# Patient Record
Sex: Male | Born: 1975
Health system: Southern US, Community
[De-identification: ages and names within clinical notes are randomized; demographics above are authoritative.]

## PROBLEM LIST (undated history)

## (undated) DIAGNOSIS — T7840XA Allergy, unspecified, initial encounter: Secondary | ICD-10-CM

## (undated) DIAGNOSIS — I10 Essential (primary) hypertension: Secondary | ICD-10-CM

## (undated) DIAGNOSIS — Z87442 Personal history of urinary calculi: Secondary | ICD-10-CM

## (undated) DIAGNOSIS — J309 Allergic rhinitis, unspecified: Secondary | ICD-10-CM

## (undated) DIAGNOSIS — D229 Melanocytic nevi, unspecified: Secondary | ICD-10-CM

## (undated) HISTORY — DX: Essential (primary) hypertension: I10

## (undated) HISTORY — DX: Melanocytic nevi, unspecified: D22.9

## (undated) HISTORY — PX: WISDOM TOOTH EXTRACTION: SHX21

## (undated) HISTORY — DX: Allergy, unspecified, initial encounter: T78.40XA

## (undated) HISTORY — PX: OTHER SURGICAL HISTORY: SHX169

## (undated) HISTORY — DX: Allergic rhinitis, unspecified: J30.9

---

## 2004-12-12 ENCOUNTER — Ambulatory Visit: Payer: Self-pay | Admitting: Internal Medicine

## 2005-11-27 ENCOUNTER — Emergency Department (HOSPITAL_COMMUNITY): Admission: EM | Admit: 2005-11-27 | Discharge: 2005-11-27 | Payer: Self-pay | Admitting: Family Medicine

## 2007-08-04 ENCOUNTER — Emergency Department (HOSPITAL_COMMUNITY): Admission: EM | Admit: 2007-08-04 | Discharge: 2007-08-04 | Payer: Self-pay | Admitting: Emergency Medicine

## 2007-11-12 ENCOUNTER — Emergency Department (HOSPITAL_COMMUNITY): Admission: EM | Admit: 2007-11-12 | Discharge: 2007-11-12 | Payer: Self-pay | Admitting: Emergency Medicine

## 2008-10-05 ENCOUNTER — Emergency Department (HOSPITAL_COMMUNITY): Admission: EM | Admit: 2008-10-05 | Discharge: 2008-10-05 | Payer: Self-pay | Admitting: Emergency Medicine

## 2010-01-30 ENCOUNTER — Ambulatory Visit: Payer: Self-pay | Admitting: Sports Medicine

## 2010-01-30 DIAGNOSIS — M25579 Pain in unspecified ankle and joints of unspecified foot: Secondary | ICD-10-CM | POA: Insufficient documentation

## 2010-01-30 DIAGNOSIS — M25569 Pain in unspecified knee: Secondary | ICD-10-CM | POA: Insufficient documentation

## 2010-01-30 DIAGNOSIS — M24873 Other specific joint derangements of unspecified ankle, not elsewhere classified: Secondary | ICD-10-CM | POA: Insufficient documentation

## 2010-01-30 DIAGNOSIS — M24876 Other specific joint derangements of unspecified foot, not elsewhere classified: Secondary | ICD-10-CM

## 2010-10-03 NOTE — Assessment & Plan Note (Signed)
Summary: FOOT AND KNEE PAIN/MJD   Vital Signs:  Patient profile:   35 year old male Height:      72 inches Weight:      210 pounds BMI:     28.58 BP sitting:   132 / 89  Vitals Entered By: Lillia Pauls CMA (Jan 30, 2010 3:45 PM)  History of Present Illness: 35 yo M here with left ankle and left knee pain  1. 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 Does not have a brace.  No prior h/o PT for this. Over weekend pain resolved.  2. Left knee pain Less pain than a clicking sensation when he fully extends at the knee No swelling No catching, locking, or instability. No prior injuries or surgeries.  Allergies (verified): No Known Drug Allergies  Physical Exam  General:  Well-developed,well-nourished,in no acute distress; alert,appropriate and cooperative throughout examination Msk:  L Knee: Normal to inspection with no erythema or effusion or obvious bony abnormalities. Palpation normal with no warmth or joint line tenderness or patellar tenderness or condyle tenderness. ROM normal in flexion and extension and lower leg rotation. Ligaments with solid consistent endpoints including ACL, PCL, LCL, MCL. Negative Mcmurray's and provocative meniscal tests. Non painful patellar compression. Patellar and quadriceps tendons unremarkable. Hamstring and quadriceps strength is normal.   L Ankle: No visible erythema or swelling. Range of motion is full in all directions. Strength is 5/5 in all directions. Squeeze test negative. 2+ ant drawer and talar tilt bilaterally. Talar dome nontender; No pain at base of 5th MT; No tenderness over cuboid; No tenderness over N spot or navicular prominence No tenderness on posterior aspects of lateral and  medial malleolus No sign of peroneal tendon subluxations; Able to walk 4 steps.   Impression & Recommendations:  Problem # 1:  ANKLE PAIN, LEFT (ICD-719.47) Assessment New Pain has resolved.  Locking sensation he gets in ankle is difficult to assess - was unable to reproduce here and exam negative outside of ankle instability.  Shown basic theraband exercises.  If instability becomes more of an issue can also give him an ASO and/or refer for formal PT.  Locking is not painful to him and he can move ankle around to 'unlock it.'  F/u as needed.  Problem # 2:  KNEE PAIN, LEFT (ZOX-096.04) Assessment: New Reassured regarding exam and non-painful clicking.  If swelling, mechanical symptoms or pain, return for reevaluation.  Problem # 3:  ANKLE INSTABILITY (VWU-981.19) Assessment: New rehab exercises demonstrated.  Patient Instructions: 1)  You do have ankle instability on both sides. 2)  Do the theraband exercises 3 times a week - 3 sets of 10 in each direction. 3)  Your knee exam was normal today - the ligaments, cartilage, and bones on exam have no damage to them. 4)  If this flares up again on you, come back to see [place] - we can reexamine you then ultrasound the ankle if necessary.

## 2011-01-18 ENCOUNTER — Encounter: Payer: Self-pay | Admitting: Family Medicine

## 2011-01-18 ENCOUNTER — Ambulatory Visit (INDEPENDENT_AMBULATORY_CARE_PROVIDER_SITE_OTHER): Payer: 59 | Admitting: Family Medicine

## 2011-01-18 DIAGNOSIS — IMO0002 Reserved for concepts with insufficient information to code with codable children: Secondary | ICD-10-CM

## 2011-01-18 DIAGNOSIS — M25569 Pain in unspecified knee: Secondary | ICD-10-CM

## 2011-01-18 DIAGNOSIS — S83249A Other tear of medial meniscus, current injury, unspecified knee, initial encounter: Secondary | ICD-10-CM | POA: Insufficient documentation

## 2011-01-18 NOTE — Progress Notes (Addendum)
  Subjective:    Patient ID: Charles Adams, male    DOB: 01-Mar-1976, 35 y.o.   MRN: [id number]  HPI 35 year old male seen last year by Dr. Pearletha Forge for some ankle and knee issues presents with a new complaint of left knee pain and swelling. He is an avid Building services engineer. He states 2 weeks ago after playing basketball he noticed some pain stiffness and swellingl, but denies a specific injury. 3 days ago while playing basketball he came down and landed funny but denies hearing a big pop or tear. Initially had some issues walking but was able to continue playing the rest of that night, however later on his left knee swelled up quite a bit. He's been icing. He states now it does feel full in the back. He also has pain with bending when going downstairs and also with full knee extension. He does not complaining of a lot of locking or instability symptoms while walking.   Review of Systems Denies fever, sweats, chills, weight loss. Denies a history of gout or other rheumatic disease.    Objective:   Physical Exam Gen. appearance well-appearing male in no distress Left knee range of motion 0 to 115. He is a 1-2+ effusion. No overlying erythema or warmth. No lateral jointline tenderness palpation. Positive medial joint line tenderness both in the midportion and more posteriorly. I think I can feel some extruded meniscal material. He has a negative patellar apprehension test. Initially I had a difficult Lachman's exam, but after aspiration I think it feels very solid. He has a positive medial McMurray's. Varus and valgus stress tests are stable.  MSK ultrasound: Left knee show significant fluid in the suprapatellar pouch. Quadriceps and patellar tendons are intact. Lateral joint line shows an intact meniscus with nl appearing LCL and IT band. Medial joint line in a couple different regions show torn medial meniscus that has extruded to a fair degree. Also some edema in that region. I think his posterior  horn looks okay.       Assessment & Plan:  Acute left knee pain and swelling, most consistent with medial meniscus tear. I think his MCL and anterior cruciate ligament are both intact at this point. -We aspirated his knee and got out 49 cc of normal-appearing joint fluid. See procedure note below. -DonJoy knee brace to help with compression and support. -Okay to take over-the-counter anti-inflammatories and Tylenol as needed -Icing a couple times daily for the next few days -Refrain from basketball for an other high-risk activities for now until followup -Followup one month. I explained that I don't think she will need surgery for this and hopefully will heal on its own, but it may take several weeks for this to get better. I did discuss the need for possible arthroscopic surgery if does not get better in the future, but hopefully he will not need this.  Consent obtained and verified. Sterile betadine prep. Furthur cleansed with alcohol. Topical analgesic spray: Ethyl chloride. Joint: Lt knee aspiration, which resulted in 49 cc of clear joint fluid.  Did not send for analysis. Approached in typical fashion with: superolateal approach Completed without difficulty Meds: 8 cc lidocaine 1% for numbing Needle: 21 G for both numbing and aspiration Aftercare instructions and Red flags advised.

## 2011-05-01 ENCOUNTER — Inpatient Hospital Stay (HOSPITAL_COMMUNITY): Admission: RE | Admit: 2011-05-01 | Payer: 59 | Source: Ambulatory Visit

## 2013-12-15 ENCOUNTER — Ambulatory Visit (INDEPENDENT_AMBULATORY_CARE_PROVIDER_SITE_OTHER): Payer: 59 | Admitting: Internal Medicine

## 2013-12-15 ENCOUNTER — Encounter: Payer: Self-pay | Admitting: Internal Medicine

## 2013-12-15 ENCOUNTER — Other Ambulatory Visit (INDEPENDENT_AMBULATORY_CARE_PROVIDER_SITE_OTHER): Payer: 59

## 2013-12-15 VITALS — BP 106/80 | HR 75 | Temp 98.8°F | Ht 72.0 in | Wt 233.5 lb

## 2013-12-15 DIAGNOSIS — J309 Allergic rhinitis, unspecified: Secondary | ICD-10-CM | POA: Insufficient documentation

## 2013-12-15 DIAGNOSIS — Z0001 Encounter for general adult medical examination with abnormal findings: Secondary | ICD-10-CM | POA: Insufficient documentation

## 2013-12-15 DIAGNOSIS — D239 Other benign neoplasm of skin, unspecified: Secondary | ICD-10-CM

## 2013-12-15 DIAGNOSIS — Z0000 Encounter for general adult medical examination without abnormal findings: Secondary | ICD-10-CM

## 2013-12-15 DIAGNOSIS — D229 Melanocytic nevi, unspecified: Secondary | ICD-10-CM | POA: Insufficient documentation

## 2013-12-15 HISTORY — DX: Melanocytic nevi, unspecified: D22.9

## 2013-12-15 LAB — URINALYSIS, ROUTINE W REFLEX MICROSCOPIC
BILIRUBIN URINE: NEGATIVE
Hgb urine dipstick: NEGATIVE
KETONES UR: NEGATIVE
LEUKOCYTES UA: NEGATIVE
Nitrite: NEGATIVE
RBC / HPF: NONE SEEN (ref 0–?)
Specific Gravity, Urine: 1.02 (ref 1.000–1.030)
TOTAL PROTEIN, URINE-UPE24: NEGATIVE
UROBILINOGEN UA: 0.2 (ref 0.0–1.0)
Urine Glucose: NEGATIVE
WBC UA: NONE SEEN (ref 0–?)
pH: 7 (ref 5.0–8.0)

## 2013-12-15 LAB — CBC WITH DIFFERENTIAL/PLATELET
BASOS PCT: 0.4 % (ref 0.0–3.0)
Basophils Absolute: 0 10*3/uL (ref 0.0–0.1)
EOS ABS: 0.1 10*3/uL (ref 0.0–0.7)
Eosinophils Relative: 2.4 % (ref 0.0–5.0)
HCT: 41 % (ref 39.0–52.0)
Hemoglobin: 13.9 g/dL (ref 13.0–17.0)
Lymphocytes Relative: 30.4 % (ref 12.0–46.0)
Lymphs Abs: 1.7 10*3/uL (ref 0.7–4.0)
MCHC: 33.9 g/dL (ref 30.0–36.0)
MCV: 87.8 fl (ref 78.0–100.0)
MONO ABS: 0.6 10*3/uL (ref 0.1–1.0)
Monocytes Relative: 10.3 % (ref 3.0–12.0)
NEUTROS PCT: 56.5 % (ref 43.0–77.0)
Neutro Abs: 3.2 10*3/uL (ref 1.4–7.7)
PLATELETS: 201 10*3/uL (ref 150.0–400.0)
RBC: 4.67 Mil/uL (ref 4.22–5.81)
RDW: 13 % (ref 11.5–14.6)
WBC: 5.7 10*3/uL (ref 4.5–10.5)

## 2013-12-15 LAB — LIPID PANEL
CHOL/HDL RATIO: 4
Cholesterol: 184 mg/dL (ref 0–200)
HDL: 43.6 mg/dL (ref >39.00)
LDL CALC: 123 mg/dL — AB (ref 0–99)
Triglycerides: 85 mg/dL (ref 0.0–149.0)
VLDL: 17 mg/dL (ref 0.0–40.0)

## 2013-12-15 LAB — BASIC METABOLIC PANEL
BUN: 14 mg/dL (ref 6–23)
CHLORIDE: 108 meq/L (ref 96–112)
CO2: 27 mEq/L (ref 19–32)
Calcium: 8.7 mg/dL (ref 8.4–10.5)
Creatinine, Ser: 0.7 mg/dL (ref 0.4–1.5)
GFR: 125.93 mL/min (ref >60.00)
Glucose, Bld: 89 mg/dL (ref 70–99)
POTASSIUM: 4.5 meq/L (ref 3.5–5.1)
Sodium: 140 mEq/L (ref 135–145)

## 2013-12-15 LAB — HEPATIC FUNCTION PANEL
ALT: 23 U/L (ref 0–53)
AST: 25 U/L (ref 0–37)
Albumin: 3.7 g/dL (ref 3.5–5.2)
Alkaline Phosphatase: 44 U/L (ref 39–117)
BILIRUBIN DIRECT: 0.1 mg/dL (ref 0.0–0.3)
BILIRUBIN TOTAL: 1.3 mg/dL — AB (ref 0.3–1.2)
Total Protein: 6.5 g/dL (ref 6.0–8.3)

## 2013-12-15 LAB — TSH: TSH: 2.44 u[IU]/mL (ref 0.35–5.50)

## 2013-12-15 NOTE — Progress Notes (Signed)
Subjective:    Patient ID: Charles Adams, male    DOB: [birth year], 38 y.o.   MRN: [id number]  HPI  Here for wellness and f/u;  Overall doing ok;  Pt denies CP, worsening SOB, DOE, wheezing, orthopnea, PND, worsening LE edema, palpitations, dizziness or syncope.  Pt denies neurological change such as new headache, facial or extremity weakness.  Pt denies polydipsia, polyuria, or low sugar symptoms. Pt states overall good compliance with treatment and medications, good tolerability, and has been trying to follow lower cholesterol diet.  Pt denies worsening depressive symptoms, suicidal ideation or panic. No fever, night sweats, wt loss, loss of appetite, or other constitutional symptoms.  Pt states good ability with ADL's, has low fall risk, home safety reviewed and adequate, no other significant changes in hearing or vision, and only occasionally active with exercise.  Has no PCP for about 10 yrs.  No current complaints.  Had hx of what sounds like partial tear of right knee meniscus, now healed Past Medical History  Diagnosis Date  . Allergic rhinitis, cause unspecified    Past Surgical History  Procedure Laterality Date  . Eye surgury Right 38yo    right eye metal fragment removal    reports that he has never smoked. He has never used smokeless tobacco. He reports that he does not drink alcohol or use illicit drugs. family history includes Arthritis in his other; Cancer in his father and other; Diabetes in his other; Heart disease in his other. No Known Allergies No current outpatient prescriptions on file prior to visit.   No current facility-administered medications on file prior to visit.    Review of Systems Constitutional: Negative for diaphoresis, activity change, appetite change or unexpected weight change.  HENT: Negative for hearing loss, ear pain, facial swelling, mouth sores and neck stiffness.   Eyes: Negative for pain, redness and visual disturbance.  Respiratory: Negative  for shortness of breath and wheezing.   Cardiovascular: Negative for chest pain and palpitations.  Gastrointestinal: Negative for diarrhea, blood in stool, abdominal distention or other pain Genitourinary: Negative for hematuria, flank pain or change in urine volume.  Musculoskeletal: Negative for myalgias and joint swelling.  Skin: Negative for color change and wound.  Neurological: Negative for syncope and numbness. other than noted Hematological: Negative for adenopathy.  Psychiatric/Behavioral: Negative for hallucinations, self-injury, decreased concentration and agitation.      Objective:   Physical Exam BP 106/80  Pulse 75  Temp(Src) 98.8 F (37.1 C) (Oral)  Ht 6' (1.829 m)  Wt 233 lb 8 oz (105.915 kg)  BMI 31.66 kg/m2  SpO2 98% VS noted,  Constitutional: Pt is oriented to person, place, and time. Appears well-developed and well-nourished.  Head: Normocephalic and atraumatic.  Right Ear: External ear normal.  Left Ear: External ear normal.  Nose: Nose normal.  Mouth/Throat: Oropharynx is clear and moist.  Eyes: Conjunctivae and EOM are normal. Pupils are equal, round, and reactive to light.  Neck: Normal range of motion. Neck supple. No JVD present. No tracheal deviation present.  Cardiovascular: Normal rate, regular rhythm, normal heart sounds and intact distal pulses.   Pulmonary/Chest: Effort normal and breath sounds normal.  Abdominal: Soft. Bowel sounds are normal. There is no tenderness. No HSM  Musculoskeletal: Normal range of motion. Exhibits no edema.  Lymphadenopathy:  Has no cervical adenopathy.  Neurological: Pt is alert and oriented to person, place, and time. Pt has normal reflexes. No cranial nerve deficit.  Skin: Skin is warm and  dry. No rash noted. Has numerous but benign appearing moles to torso, several darker in color Psychiatric:  Has  normal mood and affect. Behavior is normal.     Assessment & Plan:

## 2013-12-15 NOTE — Assessment & Plan Note (Signed)
Pt declines derm referral, plans to call his prior derm for total body check exam soon

## 2013-12-15 NOTE — Patient Instructions (Signed)
Please make sure to follow up with your dermatologist yearly for a total body mole check  Please continue your efforts at being more active, low cholesterol diet, and weight control.  You are otherwise up to date with prevention measures today.  Please go to the LAB in the Basement (turn left off the elevator) for the tests to be done today  You will be contacted by phone if any changes need to be made immediately.  Otherwise, you will receive a letter about your results with an explanation, but please check with MyChart first.  Please remember to sign up for MyChart if you have not done so, as this will be important to you in the future with finding out test results, communicating by private email, and scheduling acute appointments online when needed.  Please return in 1 year for your yearly visit, or sooner if needed, with Lab testing done 3-5 days before

## 2013-12-15 NOTE — Progress Notes (Signed)
Pre visit review using our clinic review tool, if applicable. No additional management support is needed unless otherwise documented below in the visit note. 

## 2013-12-15 NOTE — Assessment & Plan Note (Signed)

## 2014-03-14 ENCOUNTER — Emergency Department (HOSPITAL_COMMUNITY)
Admission: EM | Admit: 2014-03-14 | Discharge: 2014-03-14 | Discharge status: Absent without leave | Payer: Self-pay | Source: Home / Self Care

## 2014-04-21 ENCOUNTER — Ambulatory Visit (INDEPENDENT_AMBULATORY_CARE_PROVIDER_SITE_OTHER): Payer: 59 | Admitting: Internal Medicine

## 2014-04-21 ENCOUNTER — Encounter: Payer: Self-pay | Admitting: Internal Medicine

## 2014-04-21 ENCOUNTER — Telehealth: Payer: Self-pay | Admitting: Internal Medicine

## 2014-04-21 VITALS — BP 100/80 | HR 94 | Temp 98.8°F | Wt 236.4 lb

## 2014-04-21 DIAGNOSIS — J209 Acute bronchitis, unspecified: Secondary | ICD-10-CM

## 2014-04-21 DIAGNOSIS — J310 Chronic rhinitis: Secondary | ICD-10-CM

## 2014-04-21 MED ORDER — HYDROCODONE-HOMATROPINE 5-1.5 MG/5ML PO SYRP: 5.0000 mL | ORAL_SOLUTION | Freq: Four times a day (QID) | ORAL | Status: DC | PRN

## 2014-04-21 MED ORDER — AMOXICILLIN 500 MG PO CAPS: 500.0000 mg | ORAL_CAPSULE | Freq: Three times a day (TID) | ORAL | Status: DC

## 2014-04-21 NOTE — Patient Instructions (Signed)
Plain Mucinex (NOT D) for thick secretions ;force NON dairy fluids .   Nasal cleansing in the shower as discussed with lather of mild shampoo.After 10 seconds wash off lather while  exhaling through nostrils. Make sure that all residual soap is removed to prevent irritation.  Flonase OR Nasacort AQ 1 spray in each nostril twice a day as needed. Use the "crossover" technique into opposite nostril spraying toward opposite ear @ 45 degree angle, not straight up into nostril.  Use a Neti pot daily only  as needed for significant sinus congestion; going from open side to congested side . Plain Allegra (NOT D )  160 daily , Loratidine 10 mg , OR Zyrtec 10 mg @ bedtime  as needed for itchy eyes & sneezing. Zicam Melts or Zinc lozenges as per package label for sore throat .

## 2014-04-21 NOTE — Progress Notes (Signed)
Pre visit review using our clinic review tool, if applicable. No additional management support is needed unless otherwise documented below in the visit note. 

## 2014-04-21 NOTE — Telephone Encounter (Signed)
Pt request doctor note stated he was here for an appt today for his work. Please fax to pt on this number [phone].

## 2014-04-21 NOTE — Progress Notes (Signed)
   Subjective:    Patient ID: Charles Adams, male    DOB: 1975/09/17, 38 y.o.   MRN: [id number]  HPI    Symptoms started approximately 10 days ago as a productive cough with yellow/brown sputum. As of last night he developed head congestion.  At this time the major symptoms consist of a sore throat and the ongoing productive cough.  He did have minor fever elevation 8/16 only.  He believes that the illness began after exposer to sick family members.  He has used Tylenol, Mucinex, Robitussin, Nyquil, and DayQuil without significant benefit.  Other than scant nasal secretions & otic discomfort; he has no symptoms or signs of rhinosinusitis.    Review of Systems Frontal headache, facial pain , dental pain,  or otic discharge denied. No persistent fever , chills or sweats.     Objective:   Physical Exam  Positive or pertinent findings include: Mild erythema of the oropharynx. Otherwise exam is unremarkable.  General appearance:good health ;well nourished; no acute distress or increased work of breathing is present.  No  lymphadenopathy about the head, neck, or axilla noted.   Eyes: No conjunctival inflammation or lid edema is present. There is no scleral icterus.  Ears:  External ear exam shows no significant lesions or deformities.  Otoscopic examination reveals clear canals, tympanic membranes are intact bilaterally without bulging, retraction, inflammation or discharge.Minor bleeding L outer canal (uses Q tips)  Nose:  External nasal examination shows no deformity or inflammation. Nasal mucosa are pink and moist without lesions or exudates. No septal dislocation or deviation.No obstruction to airflow.   Oral exam: Dental hygiene is good; lips and gums are healthy appearing.There is no oropharyngeal  exudate noted.   Neck:  No deformities, thyromegaly, masses, or tenderness noted.   Supple with full range of motion without pain.   Heart:  Normal rate and regular rhythm. S1  and S2 normal without gallop, murmur, click, rub or other extra sounds.   Lungs:Chest clear to auscultation; no wheezes, rhonchi,rales ,or rubs present.No increased work of breathing.    Extremities:  No cyanosis, edema, or clubbing  noted    Skin: Warm & dry .        Assessment & Plan:  #1 acute bronchitis w/o bronchospasm #2 sore throat from PNDr Plan: See orders and recommendations

## 2014-04-21 NOTE — Telephone Encounter (Signed)
Note has been faxed to referenced fax #

## 2014-04-26 ENCOUNTER — Other Ambulatory Visit: Payer: Self-pay

## 2014-04-26 ENCOUNTER — Telehealth: Payer: Self-pay | Admitting: Internal Medicine

## 2014-04-26 DIAGNOSIS — J209 Acute bronchitis, unspecified: Secondary | ICD-10-CM

## 2014-04-26 MED ORDER — HYDROCODONE-HOMATROPINE 5-1.5 MG/5ML PO SYRP: 5.0000 mL | ORAL_SOLUTION | Freq: Four times a day (QID) | ORAL | Status: DC | PRN

## 2014-04-26 NOTE — Telephone Encounter (Signed)
Script has been faxed to [facility]

## 2014-04-26 NOTE — Telephone Encounter (Signed)
Pt states he is running out of the HYDROcodone-homatropine (HYDROMET) 5-1.5 MG/5ML syrup filled by Dr. Linna Darner and he would like another re-fill.  He states he still has some cough.   [place], [place] [phone] (Phone) [phone] (Fax)

## 2014-04-26 NOTE — Telephone Encounter (Signed)
Left message for patient stating he can pick up this script. It can not be faxed to [place]

## 2014-04-26 NOTE — Telephone Encounter (Signed)
OK X1 

## 2014-06-17 ENCOUNTER — Other Ambulatory Visit: Payer: Self-pay

## 2014-09-29 ENCOUNTER — Ambulatory Visit (INDEPENDENT_AMBULATORY_CARE_PROVIDER_SITE_OTHER): Payer: 59 | Admitting: Family Medicine

## 2014-09-29 ENCOUNTER — Encounter: Payer: Self-pay | Admitting: Family Medicine

## 2014-09-29 VITALS — Ht 72.0 in | Wt 246.1 lb

## 2014-09-29 DIAGNOSIS — E669 Obesity, unspecified: Secondary | ICD-10-CM

## 2014-09-29 NOTE — Patient Instructions (Addendum)
-   You can lose weight without eating a lot of veg's, but it will be harder, and it will be especially hard to keep the weight off without a diet very abundant in veg's.   - GOALS:  1. Include veg's and/or fruit at both lunch and dinner.    2. Make three lists of vegetables: (1) those you like and eat now; (2) vegetables you won't even consider; and (3) vegetables you might consider trying if they are prepared a       certain way.  Continue to eat veg's you currently eat, but from this last list, choose a vegetable to try at least 3 times a week.  Use small amounts of this vegetable, cut small,       combined with foods or seasonings you like.       TASTE PREFERENCES ARE LEARNED.  This means that it will get easier to choose foods you know are good for you if you are exposed to them enough.    3. TRACK your progress on your goals above, using the Goals Sheet provided today.   - Give some thought to whether or not you'd like to work on learning to like your tea either less sweet or unsweetened.    (The [place] recommends that women limit added sugars to 6 tsp a day (= 25 grams) and men limit them to 7 1/2 tsp per day (= 37.5 g).)    Suggestions:  Joline Salt brand nonfat [place] yogurt; Newman's Own Light Raspberry [employer].

## 2014-09-29 NOTE — Progress Notes (Signed)
Medical Nutrition Therapy:  Appt start time: 7425 end time:  1630.  Assessment:  Primary concerns today: Weight management. (E66.9; BMI 30-34.99) Charles Adams lives with his wife, 11-YO daughter, and 3-YO son.  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  Jamie's wife is also working on her weight, so he feels he has good support.  (She has recently cut out gluten.)    Learning Readiness: Ready Barriers to learning/adherence to lifestyle change: Resisting sweets.    Usual eating pattern includes 3 meals and 0 snacks per day. Frequent foods and beverages include water, [place] or chx sandw from home and pretzels for lunch.  Avoided foods include peas, broccoli, and many veg's.   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  24-hr recall: (Up at 5:15 AM) B (6 AM)-   1 1/2 c granola, 1/3 c whole milk, 4 oz o.j. Snk ( AM)-    L (12:30 PM)-  1 chx sandwich, 1 T mayo, 1/2 c pretzel sticks, 1 apple, water Snk ( PM)-   D (6 PM)-  2 c stew beef w/ few veg's, water Snk ( PM)-   Typical day? Yes.  although doesn't usually have fruit at lunch.    Progress Towards Goal(s):  In progress.   Nutritional Diagnosis:  [place]-3.3 Overweight/obesity As related to positive neergy balance.  As evidenced by BMI >33.    Intervention:  Nutrition education.  Handouts given during visit include:  AVS  Goals Sheet  Demonstrated degree of understanding via:  Teach Back   Monitoring/Evaluation:  Dietary intake, exercise, and body weight in 5 week(s).

## 2014-11-08 ENCOUNTER — Ambulatory Visit: Payer: 59 | Admitting: Family Medicine

## 2014-12-13 ENCOUNTER — Ambulatory Visit: Payer: 59 | Admitting: Family Medicine

## 2014-12-20 ENCOUNTER — Other Ambulatory Visit (INDEPENDENT_AMBULATORY_CARE_PROVIDER_SITE_OTHER): Payer: 59

## 2014-12-20 ENCOUNTER — Encounter: Payer: Self-pay | Admitting: Internal Medicine

## 2014-12-20 ENCOUNTER — Ambulatory Visit (INDEPENDENT_AMBULATORY_CARE_PROVIDER_SITE_OTHER): Payer: 59 | Admitting: Internal Medicine

## 2014-12-20 VITALS — BP 124/86 | HR 67 | Temp 97.9°F | Resp 18 | Wt 247.1 lb

## 2014-12-20 DIAGNOSIS — Z23 Encounter for immunization: Secondary | ICD-10-CM | POA: Diagnosis not present

## 2014-12-20 DIAGNOSIS — Z0000 Encounter for general adult medical examination without abnormal findings: Secondary | ICD-10-CM | POA: Diagnosis not present

## 2014-12-20 LAB — CBC WITH DIFFERENTIAL/PLATELET
Basophils Absolute: 0 10*3/uL (ref 0.0–0.1)
Basophils Relative: 0.4 % (ref 0.0–3.0)
EOS PCT: 3 % (ref 0.0–5.0)
Eosinophils Absolute: 0.1 10*3/uL (ref 0.0–0.7)
HEMATOCRIT: 42 % (ref 39.0–52.0)
Hemoglobin: 14.4 g/dL (ref 13.0–17.0)
LYMPHS ABS: 1.6 10*3/uL (ref 0.7–4.0)
Lymphocytes Relative: 35 % (ref 12.0–46.0)
MCHC: 34.4 g/dL (ref 30.0–36.0)
MCV: 85.3 fl (ref 78.0–100.0)
MONOS PCT: 9.5 % (ref 3.0–12.0)
Monocytes Absolute: 0.4 10*3/uL (ref 0.1–1.0)
Neutro Abs: 2.3 10*3/uL (ref 1.4–7.7)
Neutrophils Relative %: 52.1 % (ref 43.0–77.0)
PLATELETS: 184 10*3/uL (ref 150.0–400.0)
RBC: 4.92 Mil/uL (ref 4.22–5.81)
RDW: 12.7 % (ref 11.5–15.5)
WBC: 4.4 10*3/uL (ref 4.0–10.5)

## 2014-12-20 LAB — HEPATIC FUNCTION PANEL
ALT: 46 U/L (ref 0–53)
AST: 32 U/L (ref 0–37)
Albumin: 4.1 g/dL (ref 3.5–5.2)
Alkaline Phosphatase: 53 U/L (ref 39–117)
BILIRUBIN DIRECT: 0.1 mg/dL (ref 0.0–0.3)
BILIRUBIN TOTAL: 0.7 mg/dL (ref 0.2–1.2)
Total Protein: 6.9 g/dL (ref 6.0–8.3)

## 2014-12-20 LAB — URINALYSIS, ROUTINE W REFLEX MICROSCOPIC
BILIRUBIN URINE: NEGATIVE
HGB URINE DIPSTICK: NEGATIVE
KETONES UR: NEGATIVE
Leukocytes, UA: NEGATIVE
Nitrite: NEGATIVE
Specific Gravity, Urine: 1.015 (ref 1.000–1.030)
Total Protein, Urine: NEGATIVE
URINE GLUCOSE: NEGATIVE
Urobilinogen, UA: 0.2 (ref 0.0–1.0)
WBC UA: NONE SEEN (ref 0–?)
pH: 7 (ref 5.0–8.0)

## 2014-12-20 LAB — LIPID PANEL
CHOL/HDL RATIO: 5
CHOLESTEROL: 178 mg/dL (ref 0–200)
HDL: 38.2 mg/dL — AB (ref >39.00)
LDL CALC: 107 mg/dL — AB (ref 0–99)
NonHDL: 139.8
TRIGLYCERIDES: 162 mg/dL — AB (ref 0.0–149.0)
VLDL: 32.4 mg/dL (ref 0.0–40.0)

## 2014-12-20 LAB — TSH: TSH: 1.97 u[IU]/mL (ref 0.35–4.50)

## 2014-12-20 LAB — BASIC METABOLIC PANEL
BUN: 11 mg/dL (ref 6–23)
CALCIUM: 8.7 mg/dL (ref 8.4–10.5)
CO2: 26 meq/L (ref 19–32)
CREATININE: 0.77 mg/dL (ref 0.40–1.50)
Chloride: 106 mEq/L (ref 96–112)
GFR: 119.64 mL/min (ref >60.00)
GLUCOSE: 90 mg/dL (ref 70–99)
Potassium: 4.1 mEq/L (ref 3.5–5.1)
Sodium: 137 mEq/L (ref 135–145)

## 2014-12-20 NOTE — Progress Notes (Signed)
Pre visit review using our clinic review tool, if applicable. No additional management support is needed unless otherwise documented below in the visit note. 

## 2014-12-20 NOTE — Assessment & Plan Note (Signed)

## 2014-12-20 NOTE — Addendum Note (Signed)
Addended by: Valerie Salts on: 12/20/2014 09:22 AM   Modules accepted: Orders

## 2014-12-20 NOTE — Patient Instructions (Addendum)
You had the Tetanus shot today  Please continue all other medications as before, and refills have been done if requested.  Please have the pharmacy call with any other refills you may need.  Please continue your efforts at being more active, low cholesterol diet, and weight control.  You are otherwise up to date with prevention measures today.  Please keep your appointments with your specialists as you may have planned  Please go to the LAB in the Basement (turn left off the elevator) for the tests to be done today  You will be contacted by phone if any changes need to be made immediately.  Otherwise, you will receive a letter about your results with an explanation, but please check with MyChart first.  Please remember to sign up for MyChart if you have not done so, as this will be important to you in the future with finding out test results, communicating by private email, and scheduling acute appointments online when needed.  Please return in 1 year for your yearly visit, or sooner if needed, with Lab testing done 3-5 days before

## 2014-12-20 NOTE — Progress Notes (Signed)
Subjective:    Patient ID: Charles Adams, male    DOB: 03-14-1976, 39 y.o.   MRN: [id number]  HPI  Here for wellness and f/u;  Overall doing ok;  Pt denies Chest pain, worsening SOB, DOE, wheezing, orthopnea, PND, worsening LE edema, palpitations, dizziness or syncope.  Pt denies neurological change such as new headache, facial or extremity weakness.  Pt denies polydipsia, polyuria, or low sugar symptoms. Pt states overall good compliance with treatment and medications, good tolerability, and has been trying to follow appropriate diet.  Pt denies worsening depressive symptoms, suicidal ideation or panic. No fever, night sweats, wt loss, loss of appetite, or other constitutional symptoms.  Pt states good ability with ADL's, has low fall risk, home safety reviewed and adequate, no other significant changes in hearing or vision, and only occasionally active with exercise. No current complaints. Due for Tdap. 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  Past Medical History  Diagnosis Date  . Allergic rhinitis, cause unspecified   . Numerous moles 12/15/2013   Past Surgical History  Procedure Laterality Date  . Eye surgury Right 39yo    right eye metal fragment removal    reports that he has never smoked. He has never used smokeless tobacco. He reports that he does not drink alcohol or use illicit drugs. family history includes Arthritis in his other; Cancer in his father and other; Diabetes in his other; Heart disease in his other. No Known Allergies No current outpatient prescriptions on file prior to visit.   No current facility-administered medications on file prior to visit.   No current outpatient prescriptions on file prior to visit.   No current facility-administered medications on file prior to visit.   Review of Systems Constitutional: Negative for increased diaphoresis, other activity, appetite or siginficant weight change other than noted HENT: Negative for worsening hearing  loss, ear pain, facial swelling, mouth sores and neck stiffness.   Eyes: Negative for other worsening pain, redness or visual disturbance.  Respiratory: Negative for shortness of breath and wheezing  Cardiovascular: Negative for chest pain and palpitations.  Gastrointestinal: Negative for diarrhea, blood in stool, abdominal distention or other pain Genitourinary: Negative for hematuria, flank pain or change in urine volume.  Musculoskeletal: Negative for myalgias or other joint complaints.  Skin: Negative for color change and wound or drainage.  Neurological: Negative for syncope and numbness. other than noted Hematological: Negative for adenopathy. or other swelling Psychiatric/Behavioral: Negative for hallucinations, SI, self-injury, decreased concentration or other worsening agitation.      Objective:   Physical Exam BP 124/86 mmHg  Pulse 67  Temp(Src) 97.9 F (36.6 C) (Oral)  Resp 18  Wt 247 lb 1.3 oz (112.075 kg)  SpO2 99% VS noted,  Constitutional: Pt is oriented to person, place, and time. Appears well-developed and well-nourished, in no significant distress/obese Head: Normocephalic and atraumatic.  Right Ear: External ear normal.  Left Ear: External ear normal.  Nose: Nose normal.  Mouth/Throat: Oropharynx is clear and moist.  Eyes: Conjunctivae and EOM are normal. Pupils are equal, round, and reactive to light.  Neck: Normal range of motion. Neck supple. No JVD present. No tracheal deviation present or significant neck LA or mass Cardiovascular: Normal rate, regular rhythm, normal heart sounds and intact distal pulses.   Pulmonary/Chest: Effort normal and breath sounds without rales or wheezing  Abdominal: Soft. Bowel sounds are normal. NT. No HSM  Musculoskeletal: Normal range of motion. Exhibits no edema.  Lymphadenopathy:  Has  no cervical adenopathy.  Neurological: Pt is alert and oriented to person, place, and time. Pt has normal reflexes. No cranial nerve deficit.  Motor grossly intact Skin: Skin is warm and dry. No rash noted.  Psychiatric:  Has normal mood and affect. Behavior is normal.      Assessment & Plan:

## 2015-01-25 ENCOUNTER — Encounter: Payer: Self-pay | Admitting: Internal Medicine

## 2015-01-25 ENCOUNTER — Ambulatory Visit (INDEPENDENT_AMBULATORY_CARE_PROVIDER_SITE_OTHER): Payer: 59 | Admitting: Internal Medicine

## 2015-01-25 VITALS — BP 128/88 | HR 84 | Temp 98.4°F | Resp 14 | Ht 72.0 in | Wt 246.8 lb

## 2015-01-25 DIAGNOSIS — J0100 Acute maxillary sinusitis, unspecified: Secondary | ICD-10-CM | POA: Diagnosis not present

## 2015-01-25 DIAGNOSIS — J209 Acute bronchitis, unspecified: Secondary | ICD-10-CM

## 2015-01-25 MED ORDER — FLUTICASONE PROPIONATE 50 MCG/ACT NA SUSP: 1.0000 | Freq: Two times a day (BID) | NASAL | Status: DC | PRN

## 2015-01-25 MED ORDER — HYDROCODONE-HOMATROPINE 5-1.5 MG/5ML PO SYRP
5.0000 mL | ORAL_SOLUTION | Freq: Four times a day (QID) | ORAL | Status: DC | PRN
Start: 2015-01-25 — End: 2016-01-18

## 2015-01-25 MED ORDER — AMOXICILLIN 500 MG PO CAPS: 500.0000 mg | ORAL_CAPSULE | Freq: Three times a day (TID) | ORAL | Status: DC

## 2015-01-25 NOTE — Progress Notes (Signed)
   Subjective:    Patient ID: Charles Adams, male    DOB: Jun 08, 1976, 39 y.o.   MRN: [id number]  HPI  He describes "allergy symptoms" for 3 days after his neighbor mowed his hayfield. Today he developed a cough with brownish/yellow sputum. He has associated maxillary sinus pain, nasal congestion, and nasal obstruction. He also describes fatigue and pressure in the right ear. He's had some itchy, watery eyes. He has postnasal drainage. His temperature was 75F today with associated chills and sweats.  Review of Systems He denies frontal headache, nasal purulence, sore throat, otic discharge, wheezing, shortness of breath.    Objective:   Physical Exam  General appearance:Adequately nourished; no acute distress or increased work of breathing is present.  Pattern alopecia; beard and mustache.  Lymphatic: Shotty posterior cervical lymphadenopathy present. No axillary lymphadenopathy  Eyes: No conjunctival inflammation or lid edema is present. There is no scleral icterus.  Ears:  External ear exam shows no significant lesions or deformities.  Otoscopic examination reveals clear canals, tympanic membranes are intact bilaterally without bulging, retraction, or discharge. There is severe erythema of the right tympanic membrane.  Nose:  External nasal examination shows no deformity or inflammation. Nasal mucosa are dry and moderately erythematous without lesions or exudates No septal dislocation or deviation.No obstruction to airflow.   Oral exam: Dental hygiene is good; lips and gums are healthy appearing.There is no oropharyngeal erythema or exudate .  Neck:  No deformities, thyromegaly, masses, or tenderness noted.   Supple with full range of motion without pain.   Heart:  Normal rate and regular rhythm. S1 and S2 normal without gallop, murmur, click, rub or other extra sounds.   Lungs:Chest clear to auscultation; no wheezes, rhonchi,rales ,or rubs present.  Extremities:  No cyanosis,  edema, or clubbing  noted    Skin: Warm & dry w/o tenting or jaundice. No significant lesions or rash.       Assessment & Plan:  #1 acute maxillary sinusitis  #2 acute bronchitis  Plan: See orders and recommendations

## 2015-01-25 NOTE — Progress Notes (Signed)
Pre visit review using our clinic review tool, if applicable. No additional management support is needed unless otherwise documented below in the visit note. 

## 2015-01-25 NOTE — Patient Instructions (Signed)

## 2015-08-07 ENCOUNTER — Telehealth: Payer: 59 | Admitting: Nurse Practitioner

## 2015-08-07 DIAGNOSIS — L237 Allergic contact dermatitis due to plants, except food: Secondary | ICD-10-CM

## 2015-08-07 MED ORDER — PREDNISONE 10 MG (21) PO TBPK: 10.0000 mg | ORAL_TABLET | Freq: Every day | ORAL | Status: DC

## 2015-08-07 NOTE — Progress Notes (Signed)

## 2015-08-14 ENCOUNTER — Telehealth: Payer: 59 | Admitting: Family

## 2015-08-14 DIAGNOSIS — L237 Allergic contact dermatitis due to plants, except food: Secondary | ICD-10-CM

## 2015-08-14 MED ORDER — PREDNISONE 10 MG (21) PO TBPK: 10.0000 mg | ORAL_TABLET | Freq: Every day | ORAL | Status: DC

## 2015-08-14 NOTE — Progress Notes (Signed)
We are sorry that you are not feeing well.  Here is how we plan to help!  *I will prescribe the recommended medication for a little bit longer (same script, to extend current regimen). Also, the official recommendation if the rash involves areas near your eyes is to be seen face-to-face. I am recommending this to you now in addition to this e-visit because I cannot see the area near your eyelid to determine the risk to soft-tissues of the eye. If indeed it does involve areas close to the eye, you need to go be seen in person. If it is further away from the eye, you can wait 48-72 hours to see if things improve, then be seen face-to-face if it does not improve.   Based on what you have shared with me it looks like you have had an allergic reaction to the oily resin from a group of plants.  This resin is very sticky, so it easily attaches to your skin, clothing, tools equipment, and pet's fur.    This blistering rash is often called poison ivy rash although it can come from contact with the leaves, stems and roots of poison ivy, poison oak and poison sumac.  The oily resin contains urushiol (u-ROO-she-ol) that produces a skin rash on exposed skin.  The severity of the rash depends on the amount of urushiol that gets on your skin.  A section of skin with more urushiol on it may develop a rash sooner.  The rash usually develops 12-48 hours after exposure and can last two to three weeks.  Your skin must come in direct contact with the plant's oil to be affected.  Blister fluid doesn't spread the rash.  However, if you come into contact with a piece of clothing or pet fur that has urushiol on it, the rash may spread out.  You can also transfer the oil to other parts of your body with your fingers.  Often the rash looks like a straight line because of the way the plant brushes against your skin.    I have developed the following plan to treat your condition Since your rash is widespread or has resulted in a  large number of blisters, I have prescribed an oral corticosteroid.  Please follow these recommendations:  I have sent a prednisone dose pack to your chosen pharmacy. Be sure to follow the instructions carefully and complete the entire prescription. You may use Benadryl or Caladryl topical lotions to sooth the itch and remember cool, not hot, showers and baths can help relieve the itching!  Place cool, wet compresses on the affected area for 15-30 minutes several times a day.  You may also take oral antihistamines, such as diphenhydramine (Benadryl, others), which may also help you sleep better.  Watch your skin for any purulent (pus) drainage or red streaking from the site.  If this occurs, contact your provider.  You may require an antibiotic for a skin infection.  Make sure that the clothes you were wearing as well as any towels or sheets that may have come in contact with the oil (urushiol) are washed in detergent and hot water.         What can you do to prevent this rash?  Avoid the plants.  Learn how to identify poison ivy, poison oak and poison sumac in all seasons.  When hiking or engaging in other activities that might expose you to these plants, try to stay on cleared pathways.  If camping, make sure  you pitch your tent in an area free of these plants.  Keep pets from running through wooded areas so that urushiol doesn't accidentally stick to their fur, which you may touch.  Remove or kill the plants.  In your yard, you can get rid of poison ivy by applying an herbicide or pulling it out of the ground, including the roots, while wearing heavy gloves.  Afterward remove the gloves and thoroughly wash them and your hands.  Don't burn poison ivy or related plants because the urushiol can be carried by smoke.  Wear protective clothing.  If needed, protect your skin by wearing socks, boots, pants, long sleeves and vinyl gloves.  Wash your skin right away.  Washing off the oil with soap and water  within 30 minutes of exposure may reduce your chances of getting a poison ivy rash.  Even washing after an hour or so can help reduce the severity of the rash.  If you walk through some poison ivy and then later touch your shoes, you may get some urushiol on your hands, which may then transfer to your face or body by touching or rubbing.  If the contaminated object isn't cleaned, the urushiol on it can still cause a skin reaction years later.   Be careful not to reuse towels after you have washed your skin.  Also carefully wash clothing in detergent and hot water to remove all traces of the oil.  Handle contaminated clothing carefully so you don't transfer the urushiol to yourself, furniture, rugs or appliances.  Remember that pets can carry the oil on their fur and paws.  If you think your pet may be contaminated with urushiol, put on some long rubber gloves and give your pet a bath.  Finally, be careful not to burn these plants as the smoke can contain traces of the oil.  Inhaling the smoke may result in difficulty breathing. If that occurred you should see a physician as soon as possible.  See your doctor right away if:   The reaction is severe or widespread  You inhaled the smoke from burning poison ivy and are having difficulty breathing  Your skin continues to swell  The rash affects your eyes, mouth or genitals  Blisters are oozing pus  You develop a fever greater than 100 F (37.8 C)  The rash doesn't get better within a few weeks.  If you scratch the poison ivy rash, bacteria under your fingernails may cause the skin to become infected.  See your doctor if pus starts oozing from the blisters.  Treatment generally includes antibiotics.  Poison ivy treatments are usually limited to self-care methods.  And the rash typically goes away on its own in two to three weeks.     If the rash is widespread or results in a large number of blisters, your doctor may prescribe an oral  corticosteroid, such as prednisone.  If a bacterial infection has developed at the rash site, your doctor may give you a prescription for an oral antibiotic.  MAKE SURE YOU   Understand these instructions.  Will watch your condition.  Will get help right away if you are not doing well or get worse.  Thank you for choosing an e-visit. Your e-visit answers were reviewed by a board certified advanced clinical practitioner to complete your personal care plan. Depending upon the condition, your plan could have included both over the counter or prescription medications.  Please review your pharmacy choice. If there is a problem  you may use MyChart messaging to have the prescription routed to another pharmacy.   Your safety is important to [place]. If you have drug allergies check your prescription carefully.  You can use MyChart to ask questions about today's visit, request a non-urgent call back, or ask for a work or school excuse for 24 hours related to this e-Visit. If it has been greater than 24 hours you will need to follow up with your provider, or enter a new e-Visit to address those concerns.   You will get an email in the next two days asking about your experience. I hope that your e-visit has been valuable and will speed your recovery Thank you for choosing an e-visit.

## 2015-09-18 ENCOUNTER — Telehealth: Payer: 59 | Admitting: Family

## 2015-09-18 DIAGNOSIS — L237 Allergic contact dermatitis due to plants, except food: Secondary | ICD-10-CM | POA: Diagnosis not present

## 2015-09-18 MED ORDER — PREDNISONE 10 MG (21) PO TBPK: 10.0000 mg | ORAL_TABLET | Freq: Every day | ORAL | Status: DC

## 2015-09-18 MED FILL — predniSONE 10 MG (21) TBPK: 10 | 6 days supply | Qty: 21 | Fill #0

## 2015-09-18 NOTE — Progress Notes (Signed)

## 2015-12-21 ENCOUNTER — Encounter: Payer: 59 | Admitting: Internal Medicine

## 2016-01-03 ENCOUNTER — Encounter (HOSPITAL_COMMUNITY): Payer: Self-pay | Admitting: Emergency Medicine

## 2016-01-03 ENCOUNTER — Ambulatory Visit (INDEPENDENT_AMBULATORY_CARE_PROVIDER_SITE_OTHER): Payer: 59

## 2016-01-03 ENCOUNTER — Ambulatory Visit (HOSPITAL_COMMUNITY)
Admission: EM | Admit: 2016-01-03 | Discharge: 2016-01-03 | Disposition: A | Discharge status: Home / Self Care | Payer: 59 | Attending: Family Medicine | Admitting: Family Medicine

## 2016-01-03 DIAGNOSIS — R0789 Other chest pain: Secondary | ICD-10-CM | POA: Diagnosis not present

## 2016-01-03 DIAGNOSIS — R079 Chest pain, unspecified: Secondary | ICD-10-CM | POA: Diagnosis not present

## 2016-01-03 MED ORDER — NAPROXEN 500 MG PO TABS: 500.0000 mg | ORAL_TABLET | Freq: Two times a day (BID) | ORAL | Status: DC
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
## 2016-01-03 NOTE — Discharge Instructions (Signed)
It is a pleasure to see you today. The x-ray does not show evidence of rib fracture.  On an unrelated note, there is some noted compression of a vertebral body in the lower thoracic/upper lumbar area of your back.  I recommend following up with your primary care doctor for this finding.   For the side pain, I suspect it is musculoskeletal in nature. Naproxen 500mg  tablets, 1 tablet by mouth twice daily with food.  Heat packs frequently throughout the day.   Follow up with Dr Jenny Reichmann if not improving in the coming 7 to 10 days, or sooner if worsening or other symptoms develop.

## 2016-01-03 NOTE — ED Notes (Signed)
The patient presented to the [facility] with a complaint of left sided flank pain secondary to stretching awkwardly to pick up on object about 1.5 weeks ago. The patient stated that he no longer has a constant pain but does upon palpation of his left side.

## 2016-01-04 MED FILL — NAPROXEN 500 MG TABLET: 500 | 15 days supply | Qty: 30 | Fill #0

## 2016-01-16 ENCOUNTER — Telehealth: Payer: 59 | Admitting: Family

## 2016-01-16 DIAGNOSIS — J309 Allergic rhinitis, unspecified: Secondary | ICD-10-CM

## 2016-01-16 DIAGNOSIS — J0100 Acute maxillary sinusitis, unspecified: Secondary | ICD-10-CM

## 2016-01-16 MED ORDER — LEVOCETIRIZINE DIHYDROCHLORIDE 5 MG PO TABS: 5.0000 mg | ORAL_TABLET | Freq: Every evening | ORAL | Status: DC

## 2016-01-16 MED ORDER — FLUTICASONE PROPIONATE 50 MCG/ACT NA SUSP: 1.0000 | Freq: Two times a day (BID) | NASAL | Status: DC | PRN

## 2016-01-16 MED FILL — FLUTICASONE PROP 50 MCG SPR: 50 | 30 days supply | Qty: 16 | Fill #0

## 2016-01-16 MED FILL — LEVOCETIRIZINE 5 MG TABLET: 5 | 30 days supply | Qty: 30 | Fill #0

## 2016-01-16 NOTE — Progress Notes (Signed)
E visit for Allergic Rhinitis We are sorry that you are not feeling well.  Her is how we plan to help!  Based on what you have shared with me it looks like you have Allergic Rhinitis.  Rhinitis is when a reaction occurs that causes nasal congestion, runny nose, sneezing, and itching.  Most types of rhinitis are caused by an inflammation and are associated with symptoms in the eyes ears or throat. There are several types of rhinitis.  The most common are acute rhinitis, which is usually caused by a viral illness, allergic or seasonal rhinitis, and nonallergic or year-round rhinitis.  Nasal allergies occur certain times of the year.  Allergic rhinitis is caused when allergens in the air trigger the release of histamine in the body.  Histamine causes itching, swelling, and fluid to build up in the fragile linings of the nasal passages, sinuses and eyelids.  An itchy nose and clear discharge are common.  I recommend the following over the counter treatments: Xyzal 5 mg take 1 tablet daily  I also would recommend a nasal spray: Flonase 2 sprays into each nostril once daily  HOME CARE:   You can use an over-the-counter saline nasal spray as needed  Avoid areas where there is heavy dust, mites, or molds  Stay indoors on windy days during the pollen season  Keep windows closed in home, at least in bedroom; use air conditioner.  Use high-efficiency house air filter  Keep windows closed in car, turn AC on re-circulate  Avoid playing out with dog during pollen season  GET HELP RIGHT AWAY IF:   If your symptoms do not improve within 10 days  You become short of breath  You develop yellow or green discharge from your nose for over 3 days  You have coughing fits  MAKE SURE YOU:   Understand these instructions  Will watch your condition  Will get help right away if you are not doing well or get worse  Thank you for choosing an e-visit. Your e-visit answers were reviewed by a board  certified advanced clinical practitioner to complete your personal care plan. Depending upon the condition, your plan could have included both over the counter or prescription medications. Please review your pharmacy choice. Be sure that the pharmacy you have chosen is open so that you can pick up your prescription now.  If there is a problem you may message your provider in MyChart to have the prescription routed to another pharmacy. Your safety is important to us. If you have drug allergies check your prescription carefully.  For the next 24 hours, you can use MyChart to ask questions about today's visit, request a non-urgent call back, or ask for a work or school excuse from your e-visit provider. You will get an email in the next two days asking about your experience. I hope that your e-visit has been valuable and will speed your recovery.         

## 2016-01-18 ENCOUNTER — Encounter: Payer: Self-pay | Admitting: Internal Medicine

## 2016-01-18 ENCOUNTER — Ambulatory Visit (INDEPENDENT_AMBULATORY_CARE_PROVIDER_SITE_OTHER): Payer: 59 | Admitting: Internal Medicine

## 2016-01-18 ENCOUNTER — Other Ambulatory Visit (INDEPENDENT_AMBULATORY_CARE_PROVIDER_SITE_OTHER): Payer: 59

## 2016-01-18 VITALS — BP 126/80 | HR 73 | Temp 98.6°F | Resp 20 | Wt 254.0 lb

## 2016-01-18 DIAGNOSIS — Z0000 Encounter for general adult medical examination without abnormal findings: Secondary | ICD-10-CM

## 2016-01-18 DIAGNOSIS — D229 Melanocytic nevi, unspecified: Secondary | ICD-10-CM

## 2016-01-18 DIAGNOSIS — J309 Allergic rhinitis, unspecified: Secondary | ICD-10-CM

## 2016-01-18 LAB — CBC WITH DIFFERENTIAL/PLATELET
Basophils Absolute: 0 10*3/uL (ref 0.0–0.1)
Basophils Relative: 0.6 % (ref 0.0–3.0)
EOS PCT: 6.3 % — AB (ref 0.0–5.0)
Eosinophils Absolute: 0.3 10*3/uL (ref 0.0–0.7)
HCT: 43.3 % (ref 39.0–52.0)
Hemoglobin: 14.7 g/dL (ref 13.0–17.0)
LYMPHS ABS: 1.5 10*3/uL (ref 0.7–4.0)
Lymphocytes Relative: 27.7 % (ref 12.0–46.0)
MCHC: 34 g/dL (ref 30.0–36.0)
MCV: 86.6 fl (ref 78.0–100.0)
MONOS PCT: 9.1 % (ref 3.0–12.0)
Monocytes Absolute: 0.5 10*3/uL (ref 0.1–1.0)
NEUTROS ABS: 3.1 10*3/uL (ref 1.4–7.7)
NEUTROS PCT: 56.3 % (ref 43.0–77.0)
PLATELETS: 198 10*3/uL (ref 150.0–400.0)
RBC: 5 Mil/uL (ref 4.22–5.81)
RDW: 13.1 % (ref 11.5–15.5)
WBC: 5.5 10*3/uL (ref 4.0–10.5)

## 2016-01-18 LAB — HEPATIC FUNCTION PANEL
ALK PHOS: 54 U/L (ref 39–117)
ALT: 40 U/L (ref 0–53)
AST: 29 U/L (ref 0–37)
Albumin: 4.1 g/dL (ref 3.5–5.2)
BILIRUBIN DIRECT: 0.2 mg/dL (ref 0.0–0.3)
BILIRUBIN TOTAL: 1 mg/dL (ref 0.2–1.2)
Total Protein: 6.7 g/dL (ref 6.0–8.3)

## 2016-01-18 LAB — LIPID PANEL
CHOLESTEROL: 186 mg/dL (ref 0–200)
HDL: 33.6 mg/dL — ABNORMAL LOW (ref >39.00)
LDL Cholesterol: 124 mg/dL — ABNORMAL HIGH (ref 0–99)
NonHDL: 152.54
TRIGLYCERIDES: 144 mg/dL (ref 0.0–149.0)
Total CHOL/HDL Ratio: 6
VLDL: 28.8 mg/dL (ref 0.0–40.0)

## 2016-01-18 LAB — BASIC METABOLIC PANEL
BUN: 16 mg/dL (ref 6–23)
CALCIUM: 8.6 mg/dL (ref 8.4–10.5)
CO2: 24 meq/L (ref 19–32)
Chloride: 108 mEq/L (ref 96–112)
Creatinine, Ser: 0.71 mg/dL (ref 0.40–1.50)
GFR: 130.66 mL/min (ref >60.00)
GLUCOSE: 95 mg/dL (ref 70–99)
POTASSIUM: 4 meq/L (ref 3.5–5.1)
SODIUM: 140 meq/L (ref 135–145)

## 2016-01-18 LAB — URINALYSIS, ROUTINE W REFLEX MICROSCOPIC
Bilirubin Urine: NEGATIVE
HGB URINE DIPSTICK: NEGATIVE
Ketones, ur: NEGATIVE
LEUKOCYTES UA: NEGATIVE
Nitrite: NEGATIVE
PH: 7 (ref 5.0–8.0)
RBC / HPF: NONE SEEN (ref 0–?)
Specific Gravity, Urine: 1.01 (ref 1.000–1.030)
TOTAL PROTEIN, URINE-UPE24: NEGATIVE
Urine Glucose: NEGATIVE
Urobilinogen, UA: 0.2 (ref 0.0–1.0)

## 2016-01-18 LAB — TSH: TSH: 1.97 u[IU]/mL (ref 0.35–4.50)

## 2016-01-18 NOTE — Assessment & Plan Note (Signed)
To f/u derm as planned 

## 2016-01-18 NOTE — Patient Instructions (Signed)

## 2016-01-18 NOTE — Assessment & Plan Note (Signed)

## 2016-01-18 NOTE — Assessment & Plan Note (Signed)
Improved, to cont meds prn,  to f/u any worsening symptoms or concerns

## 2016-01-18 NOTE — Progress Notes (Signed)
Subjective:    Patient ID: Charles Adams, male    DOB: Apr 19, 1976, 40 y.o.   MRN: [id number]  HPI  Here for wellness and f/u;  Overall doing ok;  Pt denies Chest pain, worsening SOB, DOE, wheezing, orthopnea, PND, worsening LE edema, palpitations, dizziness or syncope.  Pt denies neurological change such as new headache, facial or extremity weakness.  Pt denies polydipsia, polyuria, or low sugar symptoms. Pt states overall good compliance with treatment and medications, good tolerability, and has been trying to follow appropriate diet.  Pt denies worsening depressive symptoms, suicidal ideation or panic. No fever, night sweats, wt loss, loss of appetite, or other constitutional symptoms.  Pt states good ability with ADL's, has low fall risk, home safety reviewed and adequate, no other significant changes in hearing or vision, and only occasionally active with exercise.  Plans to do more treadmill, has one at home.  Sees dermatology yearly. Does have several wks ongoing nasal allergy symptoms with clearish congestion, itch and sneezing, without fever, pain, ST, cough, swelling or wheezing, helped now with flonase and xyzal.  Also nsaid helps with left lower lateral cp - ? Muscle pull, seen at [facility] with neg cxr/rib films per pt. Past Medical History  Diagnosis Date  . Allergic rhinitis, cause unspecified   . Numerous moles 12/15/2013   Past Surgical History  Procedure Laterality Date  . Eye surgury Right 40yo    right eye metal fragment removal    reports that he has never smoked. He has never used smokeless tobacco. He reports that he does not drink alcohol or use illicit drugs. family history includes Arthritis in his other; Cancer in his father and other; Diabetes in his other; Heart disease in his other. No Known Allergies Current Outpatient Prescriptions on File Prior to Visit  Medication Sig Dispense Refill  . fluticasone (FLONASE) 50 MCG/ACT nasal spray Place 1 spray into both nostrils 2  (two) times daily as needed for allergies or rhinitis. 16 g 0  . levocetirizine (XYZAL) 5 MG tablet Take 1 tablet (5 mg total) by mouth every evening. 30 tablet 0  . naproxen (NAPROSYN) 500 MG tablet Take 1 tablet (500 mg total) by mouth 2 (two) times daily. 30 tablet 0   No current facility-administered medications on file prior to visit.    Review of Systems /Constitutional: Negative for increased diaphoresis, or other activity, appetite or siginficant weight change other than noted HENT: Negative for worsening hearing loss, ear pain, facial swelling, mouth sores and neck stiffness.   Eyes: Negative for other worsening pain, redness or visual disturbance.  Respiratory: Negative for choking or stridor Cardiovascular: Negative for other chest pain and palpitations.  Gastrointestinal: Negative for worsening diarrhea, blood in stool, or abdominal distention Genitourinary: Negative for hematuria, flank pain or change in urine volume.  Musculoskeletal: Negative for myalgias or other joint complaints.  Skin: Negative for other color change and wound or drainage.  Neurological: Negative for syncope and numbness. other than noted Hematological: Negative for adenopathy. or other swelling Psychiatric/Behavioral: Negative for hallucinations, SI, self-injury, decreased concentration or other worsening agitation.      Objective:   Physical Exam BP 126/80 mmHg  Pulse 73  Temp(Src) 98.6 F (37 C) (Oral)  Resp 20  Wt 254 lb (115.214 kg)  SpO2 97% VS noted,  Constitutional: Pt is oriented to person, place, and time. Appears well-developed and well-nourished, in no significant distress Head: Normocephalic and atraumatic  Eyes: Conjunctivae and EOM are normal. Pupils  are equal, round, and reactive to light Right Ear: External ear normal.  Left Ear: External ear normal Nose: Nose normal.  Mouth/Throat: Oropharynx is clear and moist  Neck: Normal range of motion. Neck supple. No JVD present. No  tracheal deviation present or significant neck LA or mass Cardiovascular: Normal rate, regular rhythm, normal heart sounds and intact distal pulses.   Pulmonary/Chest: Effort normal and breath sounds without rales or wheezing  Abdominal: Soft. Bowel sounds are normal. NT. No HSM  Musculoskeletal: Normal range of motion. Exhibits no edema Lymphadenopathy: Has no cervical adenopathy.  Neurological: Pt is alert and oriented to person, place, and time. Pt has normal reflexes. No cranial nerve deficit. Motor grossly intact Skin: Skin is warm and dry. No rash noted or new ulcers, numerous moles none raise, irreg or 2 toned Psychiatric:  Has normal mood and affect. Behavior is normal.     Assessment & Plan:

## 2016-01-18 NOTE — Progress Notes (Signed)
Pre visit review using our clinic review tool, if applicable. No additional management support is needed unless otherwise documented below in the visit note. 

## 2016-01-23 DIAGNOSIS — H5213 Myopia, bilateral: Secondary | ICD-10-CM | POA: Diagnosis not present

## 2016-02-15 DIAGNOSIS — H16201 Unspecified keratoconjunctivitis, right eye: Secondary | ICD-10-CM | POA: Diagnosis not present

## 2016-02-15 MED FILL — ZYLET EYE DROPS: 0.5-0.3 | 16 days supply | Qty: 5 | Fill #0

## 2016-02-22 DIAGNOSIS — D225 Melanocytic nevi of trunk: Secondary | ICD-10-CM | POA: Diagnosis not present

## 2016-02-22 DIAGNOSIS — L821 Other seborrheic keratosis: Secondary | ICD-10-CM | POA: Diagnosis not present

## 2016-02-22 DIAGNOSIS — D2262 Melanocytic nevi of left upper limb, including shoulder: Secondary | ICD-10-CM | POA: Diagnosis not present

## 2016-02-22 DIAGNOSIS — D2272 Melanocytic nevi of left lower limb, including hip: Secondary | ICD-10-CM | POA: Diagnosis not present

## 2016-02-22 DIAGNOSIS — D1801 Hemangioma of skin and subcutaneous tissue: Secondary | ICD-10-CM | POA: Diagnosis not present

## 2016-02-22 DIAGNOSIS — D2261 Melanocytic nevi of right upper limb, including shoulder: Secondary | ICD-10-CM | POA: Diagnosis not present

## 2016-02-22 DIAGNOSIS — D2271 Melanocytic nevi of right lower limb, including hip: Secondary | ICD-10-CM | POA: Diagnosis not present

## 2017-01-17 ENCOUNTER — Other Ambulatory Visit (INDEPENDENT_AMBULATORY_CARE_PROVIDER_SITE_OTHER): Payer: 59

## 2017-01-17 DIAGNOSIS — Z0000 Encounter for general adult medical examination without abnormal findings: Secondary | ICD-10-CM | POA: Diagnosis not present

## 2017-01-17 LAB — URINALYSIS, ROUTINE W REFLEX MICROSCOPIC
Bilirubin Urine: NEGATIVE
HGB URINE DIPSTICK: NEGATIVE
Ketones, ur: NEGATIVE
Leukocytes, UA: NEGATIVE
NITRITE: NEGATIVE
Specific Gravity, Urine: <=1.005 — AB (ref 1.000–1.030)
Total Protein, Urine: NEGATIVE
Urine Glucose: NEGATIVE
Urobilinogen, UA: 0.2 (ref 0.0–1.0)
WBC UA: NONE SEEN (ref 0–?)
pH: 6.5 (ref 5.0–8.0)

## 2017-01-17 LAB — HEPATIC FUNCTION PANEL
ALK PHOS: 53 U/L (ref 39–117)
ALT: 42 U/L (ref 0–53)
AST: 29 U/L (ref 0–37)
Albumin: 4.4 g/dL (ref 3.5–5.2)
BILIRUBIN DIRECT: 0.2 mg/dL (ref 0.0–0.3)
TOTAL PROTEIN: 6.8 g/dL (ref 6.0–8.3)
Total Bilirubin: 1.5 mg/dL — ABNORMAL HIGH (ref 0.2–1.2)

## 2017-01-17 LAB — BASIC METABOLIC PANEL
BUN: 10 mg/dL (ref 6–23)
CALCIUM: 8.9 mg/dL (ref 8.4–10.5)
CO2: 28 meq/L (ref 19–32)
Chloride: 104 mEq/L (ref 96–112)
Creatinine, Ser: 0.81 mg/dL (ref 0.40–1.50)
GFR: 111.66 mL/min (ref >60.00)
Glucose, Bld: 97 mg/dL (ref 70–99)
Potassium: 4.6 mEq/L (ref 3.5–5.1)
SODIUM: 138 meq/L (ref 135–145)

## 2017-01-17 LAB — CBC WITH DIFFERENTIAL/PLATELET
BASOS PCT: 0.5 % (ref 0.0–3.0)
Basophils Absolute: 0 10*3/uL (ref 0.0–0.1)
EOS PCT: 2.8 % (ref 0.0–5.0)
Eosinophils Absolute: 0.1 10*3/uL (ref 0.0–0.7)
HCT: 44.1 % (ref 39.0–52.0)
Hemoglobin: 14.7 g/dL (ref 13.0–17.0)
LYMPHS ABS: 1.6 10*3/uL (ref 0.7–4.0)
Lymphocytes Relative: 31.9 % (ref 12.0–46.0)
MCHC: 33.3 g/dL (ref 30.0–36.0)
MCV: 89.3 fl (ref 78.0–100.0)
MONOS PCT: 11.4 % (ref 3.0–12.0)
Monocytes Absolute: 0.6 10*3/uL (ref 0.1–1.0)
NEUTROS ABS: 2.6 10*3/uL (ref 1.4–7.7)
NEUTROS PCT: 53.4 % (ref 43.0–77.0)
PLATELETS: 193 10*3/uL (ref 150.0–400.0)
RBC: 4.94 Mil/uL (ref 4.22–5.81)
RDW: 13 % (ref 11.5–15.5)
WBC: 4.9 10*3/uL (ref 4.0–10.5)

## 2017-01-17 LAB — TSH: TSH: 3.35 u[IU]/mL (ref 0.35–4.50)

## 2017-01-17 LAB — PSA: PSA: 0.56 ng/mL (ref 0.10–4.00)

## 2017-01-17 LAB — LIPID PANEL
Cholesterol: 210 mg/dL — ABNORMAL HIGH (ref 0–200)
HDL: 41.8 mg/dL (ref >39.00)
LDL Cholesterol: 138 mg/dL — ABNORMAL HIGH (ref 0–99)
NONHDL: 168.52
Total CHOL/HDL Ratio: 5
Triglycerides: 153 mg/dL — ABNORMAL HIGH (ref 0.0–149.0)
VLDL: 30.6 mg/dL (ref 0.0–40.0)

## 2017-01-21 ENCOUNTER — Encounter: Payer: Self-pay | Admitting: Internal Medicine

## 2017-01-21 ENCOUNTER — Ambulatory Visit (INDEPENDENT_AMBULATORY_CARE_PROVIDER_SITE_OTHER): Payer: 59 | Admitting: Internal Medicine

## 2017-01-21 VITALS — BP 126/86 | HR 68 | Ht 72.0 in | Wt 246.0 lb

## 2017-01-21 DIAGNOSIS — Z114 Encounter for screening for human immunodeficiency virus [HIV]: Secondary | ICD-10-CM | POA: Diagnosis not present

## 2017-01-21 DIAGNOSIS — S32000A Wedge compression fracture of unspecified lumbar vertebra, initial encounter for closed fracture: Secondary | ICD-10-CM | POA: Insufficient documentation

## 2017-01-21 DIAGNOSIS — E785 Hyperlipidemia, unspecified: Secondary | ICD-10-CM | POA: Diagnosis not present

## 2017-01-21 DIAGNOSIS — Z0000 Encounter for general adult medical examination without abnormal findings: Secondary | ICD-10-CM

## 2017-01-21 NOTE — Assessment & Plan Note (Signed)

## 2017-01-21 NOTE — Assessment & Plan Note (Signed)
Father with MI in his 78's; for lower chol diet, wt control, BP monitoring and cont to not smoke, consider statin if LDl remains > 100

## 2017-01-21 NOTE — Patient Instructions (Signed)
Please continue all other medications as before, and refills have been done if requested.  Please have the pharmacy call with any other refills you may need.  Please continue your efforts at being more active, low cholesterol diet, and weight control.  You are otherwise up to date with prevention measures today.  Please keep your appointments with your specialists as you may have planned  Please return in 1 year for your yearly visit, or sooner if needed, with Lab testing done 3-5 days before  

## 2017-01-21 NOTE — Progress Notes (Signed)
Subjective:    Patient ID: Charles Adams, male    DOB: 1976-02-20, 41 y.o.   MRN: [id number]  HPI  Here for wellness and f/u;  Overall doing ok;  Pt denies Chest pain, worsening SOB, DOE, wheezing, orthopnea, PND, worsening LE edema, palpitations, dizziness or syncope.  Pt denies neurological change such as new headache, facial or extremity weakness.  Pt denies polydipsia, polyuria, or low sugar symptoms. Pt states overall good compliance with treatment and medications, good tolerability, and has been trying to follow appropriate diet.  Pt denies worsening depressive symptoms, suicidal ideation or panic. No fever, night sweats, wt loss, loss of appetite, or other constitutional symptoms.  Pt states good ability with ADL's, has low fall risk, home safety reviewed and adequate, no other significant changes in hearing or vision, and has been more active with exercise, though admits does not always watch the diet as close as possible with lower cholesterol.  Has lost wt since last visit with more activity.  Pas had no back pain, though cxr indicates prob old compression fracture low thoracic/lumbar area.  Pt was in [place] 2006 and thinks maybe occurred then.  Wt Readings from Last 3 Encounters:  01/21/17 246 lb (111.6 kg)  01/18/16 254 lb (115.2 kg)  01/25/15 246 lb 12 oz (111.9 kg)   BP Readings from Last 3 Encounters:  01/21/17 126/86  01/18/16 126/80  01/03/16 128/91   Past Medical History:  Diagnosis Date  . Allergic rhinitis, cause unspecified   . Numerous moles 12/15/2013   Past Surgical History:  Procedure Laterality Date  . eye surgury Right 41yo   right eye metal fragment removal    reports that he has never smoked. He has never used smokeless tobacco. He reports that he does not drink alcohol or use drugs. family history includes Arthritis in his other; Cancer in his father and other; Diabetes in his other; Heart disease in his other. No Known Allergies No current outpatient  prescriptions on file prior to visit.   No current facility-administered medications on file prior to visit.    Review of Systems Constitutional: Negative for other unusual diaphoresis, sweats, appetite or weight changes HENT: Negative for other worsening hearing loss, ear pain, facial swelling, mouth sores or neck stiffness.   Eyes: Negative for other worsening pain, redness or other visual disturbance.  Respiratory: Negative for other stridor or swelling Cardiovascular: Negative for other palpitations or other chest pain  Gastrointestinal: Negative for worsening diarrhea or loose stools, blood in stool, distention or other pain Genitourinary: Negative for hematuria, flank pain or other change in urine volume.  Musculoskeletal: Negative for myalgias or other joint swelling.  Skin: Negative for other color change, or other wound or worsening drainage.  Neurological: Negative for other syncope or numbness. Hematological: Negative for other adenopathy or swelling Psychiatric/Behavioral: Negative for hallucinations, other worsening agitation, SI, self-injury, or new decreased concentration All other system neg per pt    Objective:   Physical Exam BP 126/86   Pulse 68   Ht 6' (1.829 m)   Wt 246 lb (111.6 kg)   SpO2 99%   BMI 33.36 kg/m  VS noted, obese Constitutional: Pt is oriented to person, place, and time. Appears well-developed and well-nourished, in no significant distress and comfortable Head: Normocephalic and atraumatic  Eyes: Conjunctivae and EOM are normal. Pupils are equal, round, and reactive to light Right Ear: External ear normal without discharge Left Ear: External ear normal without discharge Nose: Nose without discharge  or deformity Mouth/Throat: Oropharynx is without other ulcerations and moist  Neck: Normal range of motion. Neck supple. No JVD present. No tracheal deviation present or significant neck LA or mass Cardiovascular: Normal rate, regular rhythm, normal  heart sounds and intact distal pulses.   Pulmonary/Chest: WOB normal and breath sounds without rales or wheezing  Abdominal: Soft. Bowel sounds are normal. NT. No HSM  Musculoskeletal: Normal range of motion. Exhibits no edema Lymphadenopathy: Has no other cervical adenopathy.  Neurological: Pt is alert and oriented to person, place, and time. Pt has normal reflexes. No cranial nerve deficit. Motor grossly intact, Gait intact Skin: Skin is warm and dry. No rash noted or new ulcerations Psychiatric:  Has normal mood and affect. Behavior is normal without agitation No other exam findings  01/03/2016 CHEST  2 VIEW - summary IMPRESSION: 1.  No acute cardiopulmonary disease. 2. Age-indeterminate mild (under 25%) compression deformity involving a lower thoracic/upper lumbar vertebral body with associated focal kyphosis. Correlation for point tenderness at this location is recommended.    Assessment & Plan:

## 2017-01-28 DIAGNOSIS — H5213 Myopia, bilateral: Secondary | ICD-10-CM | POA: Diagnosis not present

## 2017-02-25 DIAGNOSIS — D2272 Melanocytic nevi of left lower limb, including hip: Secondary | ICD-10-CM | POA: Diagnosis not present

## 2017-02-25 DIAGNOSIS — D225 Melanocytic nevi of trunk: Secondary | ICD-10-CM | POA: Diagnosis not present

## 2017-02-25 DIAGNOSIS — D2271 Melanocytic nevi of right lower limb, including hip: Secondary | ICD-10-CM | POA: Diagnosis not present

## 2017-02-25 DIAGNOSIS — D2262 Melanocytic nevi of left upper limb, including shoulder: Secondary | ICD-10-CM | POA: Diagnosis not present

## 2017-02-25 DIAGNOSIS — D2261 Melanocytic nevi of right upper limb, including shoulder: Secondary | ICD-10-CM | POA: Diagnosis not present

## 2017-08-25 ENCOUNTER — Telehealth: Payer: 59 | Admitting: Family

## 2017-08-25 DIAGNOSIS — H109 Unspecified conjunctivitis: Secondary | ICD-10-CM | POA: Diagnosis not present

## 2017-08-25 MED ORDER — POLYMYXIN B-TRIMETHOPRIM 10000-0.1 UNIT/ML-% OP SOLN: 1.0000 [drp] | OPHTHALMIC | 0 refills | Status: DC

## 2017-08-25 MED FILL — POLYMYXIN B/TMP EYE DROPS: 10000-0.1 | 33 days supply | Qty: 10 | Fill #0

## 2017-08-25 NOTE — Progress Notes (Signed)

## 2018-01-09 ENCOUNTER — Other Ambulatory Visit (INDEPENDENT_AMBULATORY_CARE_PROVIDER_SITE_OTHER): Payer: 59

## 2018-01-09 DIAGNOSIS — Z0000 Encounter for general adult medical examination without abnormal findings: Secondary | ICD-10-CM | POA: Diagnosis not present

## 2018-01-09 DIAGNOSIS — Z114 Encounter for screening for human immunodeficiency virus [HIV]: Secondary | ICD-10-CM

## 2018-01-09 LAB — HEPATIC FUNCTION PANEL
ALK PHOS: 46 U/L (ref 39–117)
ALT: 63 U/L — AB (ref 0–53)
AST: 38 U/L — ABNORMAL HIGH (ref 0–37)
Albumin: 4 g/dL (ref 3.5–5.2)
BILIRUBIN DIRECT: 0.2 mg/dL (ref 0.0–0.3)
BILIRUBIN TOTAL: 1 mg/dL (ref 0.2–1.2)
TOTAL PROTEIN: 6.7 g/dL (ref 6.0–8.3)

## 2018-01-09 LAB — CBC WITH DIFFERENTIAL/PLATELET
BASOS PCT: 0.6 % (ref 0.0–3.0)
Basophils Absolute: 0 10*3/uL (ref 0.0–0.1)
EOS ABS: 0.2 10*3/uL (ref 0.0–0.7)
Eosinophils Relative: 2.8 % (ref 0.0–5.0)
HEMATOCRIT: 42.7 % (ref 39.0–52.0)
HEMOGLOBIN: 14.5 g/dL (ref 13.0–17.0)
LYMPHS PCT: 28.7 % (ref 12.0–46.0)
Lymphs Abs: 1.6 10*3/uL (ref 0.7–4.0)
MCHC: 33.9 g/dL (ref 30.0–36.0)
MCV: 88.9 fl (ref 78.0–100.0)
MONO ABS: 0.5 10*3/uL (ref 0.1–1.0)
Monocytes Relative: 8.9 % (ref 3.0–12.0)
Neutro Abs: 3.3 10*3/uL (ref 1.4–7.7)
Neutrophils Relative %: 59 % (ref 43.0–77.0)
Platelets: 204 10*3/uL (ref 150.0–400.0)
RBC: 4.8 Mil/uL (ref 4.22–5.81)
RDW: 13 % (ref 11.5–15.5)
WBC: 5.6 10*3/uL (ref 4.0–10.5)

## 2018-01-09 LAB — LIPID PANEL
Cholesterol: 184 mg/dL (ref 0–200)
HDL: 35.2 mg/dL — AB (ref >39.00)
LDL Cholesterol: 115 mg/dL — ABNORMAL HIGH (ref 0–99)
NONHDL: 149.11
Total CHOL/HDL Ratio: 5
Triglycerides: 170 mg/dL — ABNORMAL HIGH (ref 0.0–149.0)
VLDL: 34 mg/dL (ref 0.0–40.0)

## 2018-01-09 LAB — BASIC METABOLIC PANEL
BUN: 12 mg/dL (ref 6–23)
CO2: 27 mEq/L (ref 19–32)
CREATININE: 0.8 mg/dL (ref 0.40–1.50)
Calcium: 8.5 mg/dL (ref 8.4–10.5)
Chloride: 107 mEq/L (ref 96–112)
GFR: 112.73 mL/min (ref >60.00)
GLUCOSE: 93 mg/dL (ref 70–99)
POTASSIUM: 4.7 meq/L (ref 3.5–5.1)
Sodium: 141 mEq/L (ref 135–145)

## 2018-01-09 LAB — URINALYSIS, ROUTINE W REFLEX MICROSCOPIC
BILIRUBIN URINE: NEGATIVE
HGB URINE DIPSTICK: NEGATIVE
KETONES UR: NEGATIVE
LEUKOCYTES UA: NEGATIVE
Nitrite: NEGATIVE
PH: 6.5 (ref 5.0–8.0)
RBC / HPF: NONE SEEN (ref 0–?)
Specific Gravity, Urine: <=1.005 — AB (ref 1.000–1.030)
Total Protein, Urine: NEGATIVE
UROBILINOGEN UA: 0.2 (ref 0.0–1.0)
Urine Glucose: NEGATIVE
WBC UA: NONE SEEN (ref 0–?)

## 2018-01-09 LAB — TSH: TSH: 1.9 u[IU]/mL (ref 0.35–4.50)

## 2018-01-09 LAB — PSA: PSA: 0.62 ng/mL (ref 0.10–4.00)

## 2018-01-10 LAB — HIV ANTIBODY (ROUTINE TESTING W REFLEX): HIV: NONREACTIVE

## 2018-01-22 ENCOUNTER — Encounter: Payer: Self-pay | Admitting: Internal Medicine

## 2018-01-22 ENCOUNTER — Ambulatory Visit (INDEPENDENT_AMBULATORY_CARE_PROVIDER_SITE_OTHER): Payer: 59 | Admitting: Internal Medicine

## 2018-01-22 VITALS — BP 136/88 | HR 71 | Temp 98.6°F | Ht 72.0 in | Wt 260.0 lb

## 2018-01-22 DIAGNOSIS — E785 Hyperlipidemia, unspecified: Secondary | ICD-10-CM | POA: Diagnosis not present

## 2018-01-22 DIAGNOSIS — Z0000 Encounter for general adult medical examination without abnormal findings: Secondary | ICD-10-CM

## 2018-01-22 NOTE — Patient Instructions (Signed)
Please continue all other medications as before, and refills have been done if requested.  Please have the pharmacy call with any other refills you may need.  Please continue your efforts at being more active, low cholesterol diet, and weight control.  You are otherwise up to date with prevention measures today.  Please keep your appointments with your specialists as you may have planned  Please return in 1 year for your yearly visit, or sooner if needed, with Lab testing done 3-5 days before  

## 2018-01-22 NOTE — Assessment & Plan Note (Signed)

## 2018-01-22 NOTE — Assessment & Plan Note (Signed)
Improved, but for lower chol , lower fat diet

## 2018-01-22 NOTE — Progress Notes (Signed)
Subjective:    Patient ID: Charles Adams, male    DOB: 09/21/1975, 42 y.o.   MRN: [id number]  HPI  Here for wellness and f/u;  Overall doing ok;  Pt denies Chest pain, worsening SOB, DOE, wheezing, orthopnea, PND, worsening LE edema, palpitations, dizziness or syncope.  Pt denies neurological change such as new headache, facial or extremity weakness.  Pt denies polydipsia, polyuria, or low sugar symptoms. Pt states overall good compliance with treatment and medications, good tolerability, and has been trying to follow appropriate diet.  Pt denies worsening depressive symptoms, suicidal ideation or panic. No fever, night sweats, wt loss, loss of appetite, or other constitutional symptoms.  Pt states good ability with ADL's, has low fall risk, home safety reviewed and adequate, no other significant changes in hearing or vision, and only occasionally active with exercise, trying to do better but has been some less in the past yr.  No new complaints or interval hx Past Medical History:  Diagnosis Date  . Allergic rhinitis, cause unspecified   . Numerous moles 12/15/2013   Past Surgical History:  Procedure Laterality Date  . eye surgury Right 42yo   right eye metal fragment removal    reports that he has never smoked. He has never used smokeless tobacco. He reports that he does not drink alcohol or use drugs. family history includes Arthritis in his other; Cancer in his father and other; Diabetes in his other; Heart disease in his other. No Known Allergies No current outpatient medications on file prior to visit.   No current facility-administered medications on file prior to visit.     Review of Systems Constitutional: Negative for other unusual diaphoresis, sweats, appetite or weight changes HENT: Negative for other worsening hearing loss, ear pain, facial swelling, mouth sores or neck stiffness.   Eyes: Negative for other worsening pain, redness or other visual disturbance.  Respiratory:  Negative for other stridor or swelling Cardiovascular: Negative for other palpitations or other chest pain  Gastrointestinal: Negative for worsening diarrhea or loose stools, blood in stool, distention or other pain Genitourinary: Negative for hematuria, flank pain or other change in urine volume.  Musculoskeletal: Negative for myalgias or other joint swelling.  Skin: Negative for other color change, or other wound or worsening drainage.  Neurological: Negative for other syncope or numbness. Hematological: Negative for other adenopathy or swelling Psychiatric/Behavioral: Negative for hallucinations, other worsening agitation, SI, self-injury, or new decreased concentration All other system neg per pt    Objective:   Physical Exam BP 136/88   Pulse 71   Temp 98.6 F (37 C) (Oral)   Ht 6' (1.829 m)   Wt 260 lb (117.9 kg)   SpO2 98%   BMI 35.26 kg/m  VS noted,  Constitutional: Pt is oriented to person, place, and time. Appears well-developed and well-nourished, in no significant distress and comfortable Head: Normocephalic and atraumatic  Eyes: Conjunctivae and EOM are normal. Pupils are equal, round, and reactive to light Right Ear: External ear normal without discharge Left Ear: External ear normal without discharge Nose: Nose without discharge or deformity Mouth/Throat: Oropharynx is without other ulcerations and moist  Neck: Normal range of motion. Neck supple. No JVD present. No tracheal deviation present or significant neck LA or mass Cardiovascular: Normal rate, regular rhythm, normal heart sounds and intact distal pulses.   Pulmonary/Chest: WOB normal and breath sounds without rales or wheezing  Abdominal: Soft. Bowel sounds are normal. NT. No HSM  Musculoskeletal: Normal range of  motion. Exhibits no edema Lymphadenopathy: Has no other cervical adenopathy.  Neurological: Pt is alert and oriented to person, place, and time. Pt has normal reflexes. No cranial nerve deficit.  Motor grossly intact, Gait intact Skin: Skin is warm and dry. No rash noted or new ulcerations Psychiatric:  Has normal mood and affect. Behavior is normal without agitation No other exam findings Lab Results  Component Value Date   WBC 5.6 01/09/2018   HGB 14.5 01/09/2018   HCT 42.7 01/09/2018   PLT 204.0 01/09/2018   GLUCOSE 93 01/09/2018   CHOL 184 01/09/2018   TRIG 170.0 (H) 01/09/2018   HDL 35.20 (L) 01/09/2018   LDLCALC 115 (H) 01/09/2018   ALT 63 (H) 01/09/2018   AST 38 (H) 01/09/2018   NA 141 01/09/2018   K 4.7 01/09/2018   CL 107 01/09/2018   CREATININE 0.80 01/09/2018   BUN 12 01/09/2018   CO2 27 01/09/2018   TSH 1.90 01/09/2018   PSA 0.62 01/09/2018          Assessment & Plan:

## 2018-02-09 DIAGNOSIS — H5213 Myopia, bilateral: Secondary | ICD-10-CM | POA: Diagnosis not present

## 2018-10-12 ENCOUNTER — Telehealth: Payer: 59 | Admitting: Family

## 2018-10-12 DIAGNOSIS — B354 Tinea corporis: Secondary | ICD-10-CM | POA: Diagnosis not present

## 2018-10-12 MED ORDER — NYSTATIN 100000 UNIT/GM EX CREA: 1.0000 "application " | TOPICAL_CREAM | Freq: Two times a day (BID) | CUTANEOUS | 0 refills | Status: DC

## 2018-10-12 MED FILL — NYSTATIN 100,000 UNIT/GM CR: 100000 | 30 days supply | Qty: 30 | Fill #0

## 2018-10-12 NOTE — Progress Notes (Signed)
E Visit for Rash  We are sorry that you are not feeling well. Here is how we plan to help!    Based upon your presentation it appears you have a fungal infection.  I have prescribed: and Nystatin cream apply to the affected area twice daily   HOME CARE:   Take cool showers and avoid direct sunlight.  Apply cool compress or wet dressings.  Take a bath in an oatmeal bath.  Sprinkle content of one Aveeno packet under running faucet with comfortably warm water.  Bathe for 15-20 minutes, 1-2 times daily.  Pat dry with a towel. Do not rub the rash.  Use hydrocortisone cream.  Take an antihistamine like Benadryl for widespread rashes that itch.  The adult dose of Benadryl is 25-50 mg by mouth 4 times daily.  Caution:  This type of medication may cause sleepiness.  Do not drink alcohol, drive, or operate dangerous machinery while taking antihistamines.  Do not take these medications if you have prostate enlargement.  Read package instructions thoroughly on all medications that you take.  GET HELP RIGHT AWAY IF:   Symptoms don't go away after treatment.  Severe itching that persists.  If you rash spreads or swells.  If you rash begins to smell.  If it blisters and opens or develops a yellow-brown crust.  You develop a fever.  You have a sore throat.  You become short of breath.  MAKE SURE YOU:  Understand these instructions. Will watch your condition. Will get help right away if you are not doing well or get worse.  Thank you for choosing an e-visit. Your e-visit answers were reviewed by a board certified advanced clinical practitioner to complete your personal care plan. Depending upon the condition, your plan could have included both over the counter or prescription medications. Please review your pharmacy choice. Be sure that the pharmacy you have chosen is open so that you can pick up your prescription now.  If there is a problem you may message your provider in MyChart  to have the prescription routed to another pharmacy. Your safety is important to us. If you have drug allergies check your prescription carefully.  For the next 24 hours, you can use MyChart to ask questions about today's visit, request a non-urgent call back, or ask for a work or school excuse from your e-visit provider. You will get an email in the next two days asking about your experience. I hope that your e-visit has been valuable and will speed your recovery.      

## 2019-01-13 ENCOUNTER — Other Ambulatory Visit (INDEPENDENT_AMBULATORY_CARE_PROVIDER_SITE_OTHER): Payer: 59

## 2019-01-13 DIAGNOSIS — Z0000 Encounter for general adult medical examination without abnormal findings: Secondary | ICD-10-CM | POA: Diagnosis not present

## 2019-01-13 LAB — CBC WITH DIFFERENTIAL/PLATELET
Basophils Absolute: 0 10*3/uL (ref 0.0–0.1)
Basophils Relative: 0.6 % (ref 0.0–3.0)
Eosinophils Absolute: 0.1 10*3/uL (ref 0.0–0.7)
Eosinophils Relative: 2.5 % (ref 0.0–5.0)
HCT: 42 % (ref 39.0–52.0)
Hemoglobin: 14.4 g/dL (ref 13.0–17.0)
Lymphocytes Relative: 32.9 % (ref 12.0–46.0)
Lymphs Abs: 1.6 10*3/uL (ref 0.7–4.0)
MCHC: 34.4 g/dL (ref 30.0–36.0)
MCV: 89 fl (ref 78.0–100.0)
Monocytes Absolute: 0.5 10*3/uL (ref 0.1–1.0)
Monocytes Relative: 10.3 % (ref 3.0–12.0)
Neutro Abs: 2.6 10*3/uL (ref 1.4–7.7)
Neutrophils Relative %: 53.7 % (ref 43.0–77.0)
Platelets: 195 10*3/uL (ref 150.0–400.0)
RBC: 4.72 Mil/uL (ref 4.22–5.81)
RDW: 13.1 % (ref 11.5–15.5)
WBC: 4.8 10*3/uL (ref 4.0–10.5)

## 2019-01-13 LAB — URINALYSIS, ROUTINE W REFLEX MICROSCOPIC
Bilirubin Urine: NEGATIVE
Hgb urine dipstick: NEGATIVE
Ketones, ur: NEGATIVE
Leukocytes,Ua: NEGATIVE
Nitrite: NEGATIVE
RBC / HPF: NONE SEEN (ref 0–?)
Specific Gravity, Urine: 1.02 (ref 1.000–1.030)
Total Protein, Urine: NEGATIVE
Urine Glucose: NEGATIVE
Urobilinogen, UA: 1 (ref 0.0–1.0)
WBC, UA: NONE SEEN (ref 0–?)
pH: 7.5 (ref 5.0–8.0)

## 2019-01-13 LAB — PSA: PSA: 0.38 ng/mL (ref 0.10–4.00)

## 2019-01-13 LAB — BASIC METABOLIC PANEL
BUN: 15 mg/dL (ref 6–23)
CO2: 28 mEq/L (ref 19–32)
Calcium: 8.4 mg/dL (ref 8.4–10.5)
Chloride: 105 mEq/L (ref 96–112)
Creatinine, Ser: 0.81 mg/dL (ref 0.40–1.50)
GFR: 104.05 mL/min (ref >60.00)
Glucose, Bld: 95 mg/dL (ref 70–99)
Potassium: 4.4 mEq/L (ref 3.5–5.1)
Sodium: 140 mEq/L (ref 135–145)

## 2019-01-13 LAB — HEPATIC FUNCTION PANEL
ALT: 58 U/L — ABNORMAL HIGH (ref 0–53)
AST: 34 U/L (ref 0–37)
Albumin: 4.2 g/dL (ref 3.5–5.2)
Alkaline Phosphatase: 54 U/L (ref 39–117)
Bilirubin, Direct: 0.2 mg/dL (ref 0.0–0.3)
Total Bilirubin: 1.4 mg/dL — ABNORMAL HIGH (ref 0.2–1.2)
Total Protein: 6.6 g/dL (ref 6.0–8.3)

## 2019-01-13 LAB — LIPID PANEL
Cholesterol: 202 mg/dL — ABNORMAL HIGH (ref 0–200)
HDL: 37.7 mg/dL — ABNORMAL LOW (ref >39.00)
LDL Cholesterol: 130 mg/dL — ABNORMAL HIGH (ref 0–99)
NonHDL: 163.84
Total CHOL/HDL Ratio: 5
Triglycerides: 171 mg/dL — ABNORMAL HIGH (ref 0.0–149.0)
VLDL: 34.2 mg/dL (ref 0.0–40.0)

## 2019-01-13 LAB — TSH: TSH: 2.54 u[IU]/mL (ref 0.35–4.50)

## 2019-01-27 ENCOUNTER — Ambulatory Visit (INDEPENDENT_AMBULATORY_CARE_PROVIDER_SITE_OTHER): Payer: 59 | Admitting: Internal Medicine

## 2019-01-27 DIAGNOSIS — E785 Hyperlipidemia, unspecified: Secondary | ICD-10-CM | POA: Diagnosis not present

## 2019-01-27 DIAGNOSIS — Z0000 Encounter for general adult medical examination without abnormal findings: Secondary | ICD-10-CM

## 2019-01-27 NOTE — Progress Notes (Signed)
Patient ID: Charles Adams, male   DOB: [birth year], 43 y.o.   MRN: [id number]  Virtual Visit via Video Note  I connected with [name] on 01/27/19 at  8:40 AM EDT by a video enabled telemedicine application and verified that I am speaking with the correct person using two identifiers.  Location: Patient: at home Provider: at office   I discussed the limitations of evaluation and management by telemedicine and the availability of in person appointments. The patient expressed understanding and agreed to proceed.  History of Present Illness: Here for wellness and f/u;  Overall doing ok;  Pt denies Chest pain, worsening SOB, DOE, wheezing, orthopnea, PND, worsening LE edema, palpitations, dizziness or syncope.  Pt denies neurological change such as new headache, facial or extremity weakness.  Pt denies polydipsia, polyuria, or low sugar symptoms. Pt states overall good compliance with treatment and medications, good tolerability, and has been trying to follow appropriate diet.  Pt denies worsening depressive symptoms, suicidal ideation or panic. No fever, night sweats, loss of appetite, or other constitutional symptoms.  Pt states good ability with ADL's, has low fall risk, home safety reviewed and adequate, no other significant changes in hearing or vision, and only occasionally active with exercise.  Wt overall down 6 lbs recently with better diet. BP results when giving blood recently 124/85.  No new complaints Past Medical History:  Diagnosis Date  . Allergic rhinitis, cause unspecified   . Numerous moles 12/15/2013   Past Surgical History:  Procedure Laterality Date  . eye surgury Right 43yo   right eye metal fragment removal    reports that he has never smoked. He has never used smokeless tobacco. He reports that he does not drink alcohol or use drugs. family history includes Arthritis in an other family member; Cancer in his father and another family member; Diabetes in an other  family member; Heart disease in an other family member. No Known Allergies No current outpatient medications on file prior to visit.   No current facility-administered medications on file prior to visit.    Observations/Objective: Alert, NAD, appropriate mood and affect, resps normal, cn 2-12 intact, moves all 4s, no visible rash or swelling Lab Results  Component Value Date   WBC 4.8 01/13/2019   HGB 14.4 01/13/2019   HCT 42.0 01/13/2019   PLT 195.0 01/13/2019   GLUCOSE 95 01/13/2019   CHOL 202 (H) 01/13/2019   TRIG 171.0 (H) 01/13/2019   HDL 37.70 (L) 01/13/2019   LDLCALC 130 (H) 01/13/2019   ALT 58 (H) 01/13/2019   AST 34 01/13/2019   NA 140 01/13/2019   K 4.4 01/13/2019   CL 105 01/13/2019   CREATININE 0.81 01/13/2019   BUN 15 01/13/2019   CO2 28 01/13/2019   TSH 2.54 01/13/2019   PSA 0.38 01/13/2019    Assessment and Plan: See notes  Follow Up Instructions: See notes   I discussed the assessment and treatment plan with the patient. The patient was provided an opportunity to ask questions and all were answered. The patient agreed with the plan and demonstrated an understanding of the instructions.   The patient was advised to call back or seek an in-person evaluation if the symptoms worsen or if the condition fails to improve as anticipated.   Cathlean Cower, MD

## 2019-01-27 NOTE — Patient Instructions (Signed)
Please continue all other medications as before, and refills have been done if requested.  Please have the pharmacy call with any other refills you may need.  Please continue your efforts at being more active, low cholesterol diet, and weight control.  You are otherwise up to date with prevention measures today.  Please keep your appointments with your specialists as you may have planned  Please return in 1 year for your yearly visit, or sooner if needed, with Lab testing done 3-5 days before  

## 2019-01-30 ENCOUNTER — Encounter: Payer: Self-pay | Admitting: Internal Medicine

## 2019-01-30 NOTE — Assessment & Plan Note (Signed)

## 2019-01-30 NOTE — Assessment & Plan Note (Signed)
For lower chol diet, declines statin 

## 2019-03-08 DIAGNOSIS — D485 Neoplasm of uncertain behavior of skin: Secondary | ICD-10-CM | POA: Diagnosis not present

## 2019-03-08 DIAGNOSIS — D1801 Hemangioma of skin and subcutaneous tissue: Secondary | ICD-10-CM | POA: Diagnosis not present

## 2019-03-08 DIAGNOSIS — D2271 Melanocytic nevi of right lower limb, including hip: Secondary | ICD-10-CM | POA: Diagnosis not present

## 2019-03-08 DIAGNOSIS — D225 Melanocytic nevi of trunk: Secondary | ICD-10-CM | POA: Diagnosis not present

## 2019-03-08 DIAGNOSIS — D2272 Melanocytic nevi of left lower limb, including hip: Secondary | ICD-10-CM | POA: Diagnosis not present

## 2019-03-08 DIAGNOSIS — D2262 Melanocytic nevi of left upper limb, including shoulder: Secondary | ICD-10-CM | POA: Diagnosis not present

## 2019-03-08 DIAGNOSIS — D2261 Melanocytic nevi of right upper limb, including shoulder: Secondary | ICD-10-CM | POA: Diagnosis not present

## 2019-03-08 DIAGNOSIS — L309 Dermatitis, unspecified: Secondary | ICD-10-CM | POA: Diagnosis not present

## 2019-03-31 DIAGNOSIS — H5213 Myopia, bilateral: Secondary | ICD-10-CM | POA: Diagnosis not present

## 2019-10-06 ENCOUNTER — Ambulatory Visit: Payer: 59

## 2019-11-26 ENCOUNTER — Encounter: Payer: Self-pay | Admitting: Family

## 2019-11-26 ENCOUNTER — Other Ambulatory Visit: Payer: Self-pay

## 2019-11-26 ENCOUNTER — Ambulatory Visit (INDEPENDENT_AMBULATORY_CARE_PROVIDER_SITE_OTHER): Payer: 59 | Admitting: Family

## 2019-11-26 VITALS — BP 140/86 | HR 91 | Temp 98.0°F | Ht 72.0 in | Wt 263.8 lb

## 2019-11-26 DIAGNOSIS — R300 Dysuria: Secondary | ICD-10-CM | POA: Diagnosis not present

## 2019-11-26 DIAGNOSIS — R319 Hematuria, unspecified: Secondary | ICD-10-CM

## 2019-11-26 LAB — POC URINALSYSI DIPSTICK (AUTOMATED)
Bilirubin, UA: NEGATIVE
Glucose, UA: NEGATIVE
Ketones, UA: NEGATIVE
Leukocytes, UA: NEGATIVE
Nitrite, UA: NEGATIVE
Protein, UA: NEGATIVE
Spec Grav, UA: 1.025 (ref 1.010–1.025)
Urobilinogen, UA: 0.2 E.U./dL
pH, UA: 6 (ref 5.0–8.0)

## 2019-11-26 MED ORDER — CIPROFLOXACIN HCL 500 MG PO TABS: 500.0000 mg | ORAL_TABLET | Freq: Two times a day (BID) | ORAL | 0 refills | Status: DC

## 2019-11-26 MED FILL — CIPROFLOXACIN HCL 500 MG TA: 500 | 10 days supply | Qty: 20 | Fill #0

## 2019-11-26 NOTE — Patient Instructions (Signed)

## 2019-11-26 NOTE — Progress Notes (Signed)
  Charles Adams is a 44 y.o. male with the following history as recorded in Epic[facility]:  Patient Active Problem List   Diagnosis Date Noted  . Traumatic compression fracture of lumbar vertebra ([place]) 01/21/2017  . Hyperlipidemia 01/21/2017  . Preventative health care 12/15/2013  . Numerous moles 12/15/2013  . Allergic rhinitis   . Acute meniscal tear, medial 01/18/2011  . ANKLE INSTABILITY 01/30/2010    Current Outpatient Medications  Medication Sig Dispense Refill  . ciprofloxacin (CIPRO) 500 MG tablet Take 1 tablet (500 mg total) by mouth 2 (two) times daily. 20 tablet 0   No current facility-administered medications for this visit.    Allergies: Patient has no known allergies.  Past Medical History:  Diagnosis Date  . Allergic rhinitis, cause unspecified   . Numerous moles 12/15/2013    Past Surgical History:  Procedure Laterality Date  . eye surgury Right 44yo   right eye metal fragment removal    Family History  Problem Relation Age of Onset  . Cancer Father        pancreatic cancer  . Heart disease Other   . Diabetes Other   . Arthritis Other   . Cancer Other        lung cancer    Social History   Tobacco Use  . Smoking status: Never Smoker  . Smokeless tobacco: Never Used  Substance Use Topics  . Alcohol use: No    Subjective:  1-2 week history of bladder pressure/ discomfort; symptoms are occurring at the end of the stream; no fever, no blood in urine; no hesitancy; not waking up at night needing to urinate; no prior history of kidney stones or UTIs;     Objective:  Vitals:   11/26/19 1354  BP: 140/86  Pulse: 91  Temp: 98 F (36.7 C)  TempSrc: Oral  SpO2: 97%  Weight: 263 lb 12.8 oz (119.7 kg)  Height: 6' (1.829 m)    General: Well developed, well nourished, in no acute distress  Skin : Warm and dry.  Head: Normocephalic and atraumatic  Lungs: Respirations unlabored; clear to auscultation bilaterally without wheeze, rales, rhonchi  CVS exam:  normal rate and regular rhythm.  Neurologic: Alert and oriented; speech intact; face symmetrical; moves all extremities well; CNII-XII intact without focal deficit  Assessment:  1. Dysuria   2. Hematuria, unspecified type     Plan:  Suspect acute prostatitis; Rx for Cipro 500 mg bid x 10 days; check urine culture today; will also plan to re-check U/A at completion of treatment to ensure hematuria has resolved;  This visit occurred during the SARS-CoV-2 public health emergency.  Safety protocols were in place, including screening questions prior to the visit, additional usage of staff PPE, and extensive cleaning of exam room while observing appropriate contact time as indicated for disinfecting solutions.      No follow-ups on file.  Orders Placed This Encounter  Procedures  . Urine Culture  . Urinalysis, Routine w reflex microscopic    Standing Status:   Future    Standing Expiration Date:   01/26/2020  . POCT Urinalysis Dipstick (Automated)    Requested Prescriptions   Signed Prescriptions Disp Refills  . ciprofloxacin (CIPRO) 500 MG tablet 20 tablet 0    Sig: Take 1 tablet (500 mg total) by mouth 2 (two) times daily.

## 2019-11-27 LAB — URINE CULTURE: Result:: NO GROWTH

## 2019-12-07 ENCOUNTER — Other Ambulatory Visit (INDEPENDENT_AMBULATORY_CARE_PROVIDER_SITE_OTHER): Payer: 59

## 2019-12-07 DIAGNOSIS — R300 Dysuria: Secondary | ICD-10-CM

## 2019-12-07 DIAGNOSIS — R319 Hematuria, unspecified: Secondary | ICD-10-CM

## 2019-12-08 LAB — URINALYSIS, ROUTINE W REFLEX MICROSCOPIC
Bilirubin Urine: NEGATIVE
Hgb urine dipstick: NEGATIVE
Ketones, ur: NEGATIVE
Leukocytes,Ua: NEGATIVE
Nitrite: NEGATIVE
RBC / HPF: NONE SEEN (ref 0–?)
Specific Gravity, Urine: 1.025 (ref 1.000–1.030)
Total Protein, Urine: NEGATIVE
Urine Glucose: NEGATIVE
Urobilinogen, UA: 0.2 (ref 0.0–1.0)
pH: 5.5 (ref 5.0–8.0)

## 2019-12-13 ENCOUNTER — Other Ambulatory Visit: Payer: Self-pay

## 2019-12-13 ENCOUNTER — Encounter (HOSPITAL_COMMUNITY): Payer: Self-pay

## 2019-12-13 DIAGNOSIS — N201 Calculus of ureter: Secondary | ICD-10-CM | POA: Insufficient documentation

## 2019-12-13 DIAGNOSIS — N132 Hydronephrosis with renal and ureteral calculous obstruction: Secondary | ICD-10-CM | POA: Diagnosis not present

## 2019-12-13 DIAGNOSIS — K5730 Diverticulosis of large intestine without perforation or abscess without bleeding: Secondary | ICD-10-CM | POA: Diagnosis not present

## 2019-12-13 DIAGNOSIS — R300 Dysuria: Secondary | ICD-10-CM | POA: Diagnosis present

## 2019-12-13 DIAGNOSIS — K760 Fatty (change of) liver, not elsewhere classified: Secondary | ICD-10-CM | POA: Insufficient documentation

## 2019-12-13 DIAGNOSIS — R9431 Abnormal electrocardiogram [ECG] [EKG]: Secondary | ICD-10-CM | POA: Diagnosis not present

## 2019-12-13 DIAGNOSIS — N200 Calculus of kidney: Secondary | ICD-10-CM | POA: Diagnosis not present

## 2019-12-13 MED ORDER — SODIUM CHLORIDE 0.9% FLUSH: 3.0000 mL | Freq: Once | INTRAVENOUS | Status: DC

## 2019-12-13 MED ORDER — ONDANSETRON 4 MG PO TBDP
4.0000 mg | ORAL_TABLET | Freq: Once | ORAL | Status: AC | PRN
  Administered 2019-12-13: 4 mg via ORAL
  Filled 2019-12-13: qty 1

## 2019-12-13 NOTE — ED Triage Notes (Signed)
Patient arrived stating last week he was treated for a prostate infection due to blood in urine. Reports finishing his antibiotics and today started having mid abdominal pain that radiates to his back. Also has complaints of nausea and vomiting.

## 2019-12-14 ENCOUNTER — Emergency Department (HOSPITAL_COMMUNITY): Payer: 59

## 2019-12-14 ENCOUNTER — Emergency Department (HOSPITAL_COMMUNITY)
Admission: EM | Admit: 2019-12-14 | Discharge: 2019-12-14 | Disposition: A | Discharge status: Home / Self Care | Payer: 59 | Attending: Emergency Medicine | Admitting: Emergency Medicine

## 2019-12-14 DIAGNOSIS — K760 Fatty (change of) liver, not elsewhere classified: Secondary | ICD-10-CM

## 2019-12-14 DIAGNOSIS — K5730 Diverticulosis of large intestine without perforation or abscess without bleeding: Secondary | ICD-10-CM

## 2019-12-14 DIAGNOSIS — N201 Calculus of ureter: Secondary | ICD-10-CM

## 2019-12-14 DIAGNOSIS — N132 Hydronephrosis with renal and ureteral calculous obstruction: Secondary | ICD-10-CM | POA: Diagnosis not present

## 2019-12-14 LAB — COMPREHENSIVE METABOLIC PANEL
ALT: 74 U/L — ABNORMAL HIGH (ref 0–44)
AST: 48 U/L — ABNORMAL HIGH (ref 15–41)
Albumin: 4.5 g/dL (ref 3.5–5.0)
Alkaline Phosphatase: 52 U/L (ref 38–126)
Anion gap: 11 (ref 5–15)
BUN: 20 mg/dL (ref 6–20)
CO2: 26 mmol/L (ref 22–32)
Calcium: 9 mg/dL (ref 8.9–10.3)
Chloride: 106 mmol/L (ref 98–111)
Creatinine, Ser: 1.06 mg/dL (ref 0.61–1.24)
GFR calc Af Amer: >60 mL/min (ref >60)
GFR calc non Af Amer: >60 mL/min (ref >60)
Glucose, Bld: 120 mg/dL — ABNORMAL HIGH (ref 70–99)
Potassium: 4.4 mmol/L (ref 3.5–5.1)
Sodium: 143 mmol/L (ref 135–145)
Total Bilirubin: 1.1 mg/dL (ref 0.3–1.2)
Total Protein: 7.6 g/dL (ref 6.5–8.1)

## 2019-12-14 LAB — CBC
HCT: 46.9 % (ref 39.0–52.0)
Hemoglobin: 15.3 g/dL (ref 13.0–17.0)
MCH: 29.7 pg (ref 26.0–34.0)
MCHC: 32.6 g/dL (ref 30.0–36.0)
MCV: 90.9 fL (ref 80.0–100.0)
Platelets: 224 10*3/uL (ref 150–400)
RBC: 5.16 MIL/uL (ref 4.22–5.81)
RDW: 12.4 % (ref 11.5–15.5)
WBC: 14 10*3/uL — ABNORMAL HIGH (ref 4.0–10.5)
nRBC: 0 % (ref 0.0–0.2)

## 2019-12-14 LAB — URINALYSIS, ROUTINE W REFLEX MICROSCOPIC
Bilirubin Urine: NEGATIVE
Glucose, UA: NEGATIVE mg/dL
Ketones, ur: 5 mg/dL — AB
Leukocytes,Ua: NEGATIVE
Nitrite: NEGATIVE
Protein, ur: 30 mg/dL — AB
RBC / HPF: >50 RBC/hpf — ABNORMAL HIGH (ref 0–5)
Specific Gravity, Urine: 1.013 (ref 1.005–1.030)
pH: 7 (ref 5.0–8.0)

## 2019-12-14 LAB — LIPASE, BLOOD: Lipase: 21 U/L (ref 11–51)

## 2019-12-14 MED ORDER — OXYCODONE HCL 5 MG PO TABS: 5.0000 mg | ORAL_TABLET | ORAL | 0 refills | Status: DC | PRN

## 2019-12-14 MED ORDER — TAMSULOSIN HCL 0.4 MG PO CAPS: 0.4000 mg | ORAL_CAPSULE | Freq: Every day | ORAL | 0 refills | Status: DC

## 2019-12-14 MED ORDER — ONDANSETRON 4 MG PO TBDP: 4.0000 mg | ORAL_TABLET | Freq: Three times a day (TID) | ORAL | 0 refills | Status: DC | PRN

## 2019-12-14 MED FILL — TAMSULOSIN HCL 0.4 MG CAP: 0.4 | 7 days supply | Qty: 7 | Fill #0

## 2019-12-14 MED FILL — oxyCODONE HCL 5 MG TABS: 5 | 2 days supply | Qty: 8 | Fill #0

## 2019-12-14 MED FILL — ONDANSETRON ODT 4 MG TABLET: 4 | 4 days supply | Qty: 10 | Fill #0

## 2019-12-14 NOTE — ED Notes (Signed)
Patient transported to CT at this time. 

## 2019-12-14 NOTE — Discharge Instructions (Signed)
Please read and follow all provided instructions.  Your diagnoses today include:  1. Left ureteral stone   2. Hepatic steatosis   3. Diverticulosis of colon     Tests performed today include:  Urine test that showed blood in your urine and no definite infection  CT scan which showed a 10x4 millimeter kidney on the left side  Blood test that showed normal kidney function  Blood counts and electrolytes - shows high white blood cell count, high liver function tests (have these rechecked by your doctor in 1-2 months)  Vital signs. See below for your results today.   Medications prescribed:   Oxycodone - narcotic pain medication  DO NOT drive or perform any activities that require you to be awake and alert because this medicine can make you drowsy.    Zofran (ondansetron) - for nausea and vomiting   Flomax (tamsulosin) - relaxes smooth muscle to help kidney stones pass  Take any prescribed medications only as directed.  Home care instructions:  Follow any educational materials contained in this packet.  Please double your fluid intake for the next several days. Strain your urine and save any stones that may pass.   BE VERY CAREFUL not to take multiple medicines containing Tylenol (also called acetaminophen). Doing so can lead to an overdose which can damage your liver and cause liver failure and possibly death.   Follow-up instructions: Please follow-up with your urologist or the urologist referral (provided on front page) in the next 1 week for further evaluation of your symptoms.  Return instructions:  If you need to return to the Emergency Department, go to [facility] and not [facility]. The urologists are located at [facility] and can better care for you at this location.   Please return to the Emergency Department if you experience worsening symptoms.  Please return if you develop fever or uncontrolled pain or vomiting.  Please return if you have  any other emergent concerns.  Additional Information:  Your vital signs today were: BP (!) 175/105 (BP Location: Left Arm)    Pulse 77    Temp 98.3 F (36.8 C) (Oral)    Resp 17    Ht 6' (1.829 m)    Wt 117.9 kg    SpO2 100%    BMI 35.26 kg/m  If your blood pressure (BP) was elevated above 135/85 this visit, please have this repeated by your doctor within one month. --------------

## 2019-12-14 NOTE — ED Provider Notes (Signed)
[facility] DEPT Provider Note   CSN: [id number] Arrival date & time: 12/13/19  2212     History Chief Complaint  Patient presents with  . Abdominal Pain  . Hematuria    Charles Adams is a 44 y.o. male.  Patient presents the emergency department with complaint of dysuria, gross hematuria, pelvic and flank pain bilaterally.  Patient began having some discomfort with urination about 2 weeks ago.  He saw PCP who treated him for prostatitis.  Treatment was 500 mg Cipro twice daily x10 days.  Patient states that he finished this prescription last week.  His symptoms had improved however he continued to have a "twinge" of discomfort.  Last night his symptoms acutely worsened with recurrent hematuria and pain into his sides.  He had chills and sweats but no documented fevers.  He has been vomiting.  He was having difficulty having a bowel movement and passing urine due to pain.  No previous abdominal surgeries.  No other treatments prior to arrival.  He was given Zofran upon arrival to the emergency department.  No testicular swelling or penile discharge reported.        Past Medical History:  Diagnosis Date  . Allergic rhinitis, cause unspecified   . Numerous moles 12/15/2013    Patient Active Problem List   Diagnosis Date Noted  . Traumatic compression fracture of lumbar vertebra ([place]) 01/21/2017  . Hyperlipidemia 01/21/2017  . Preventative health care 12/15/2013  . Numerous moles 12/15/2013  . Allergic rhinitis   . Acute meniscal tear, medial 01/18/2011  . ANKLE INSTABILITY 01/30/2010    Past Surgical History:  Procedure Laterality Date  . eye surgury Right 44yo   right eye metal fragment removal       Family History  Problem Relation Age of Onset  . Cancer Father        pancreatic cancer  . Heart disease Other   . Diabetes Other   . Arthritis Other   . Cancer Other        lung cancer    Social History   Tobacco Use  . Smoking  status: Never Smoker  . Smokeless tobacco: Never Used  Substance Use Topics  . Alcohol use: No  . Drug use: No    Home Medications Prior to Admission medications   Medication Sig Start Date End Date Taking? Authorizing Provider  ciprofloxacin (CIPRO) 500 MG tablet Take 1 tablet (500 mg total) by mouth 2 (two) times daily. 11/26/19   Marrian Salvage, [place]    Allergies    Patient has no known allergies.  Review of Systems   Review of Systems  Constitutional: Negative for fever.  HENT: Negative for rhinorrhea and sore throat.   Eyes: Negative for redness.  Respiratory: Negative for cough.   Cardiovascular: Negative for chest pain.  Gastrointestinal: Positive for abdominal pain (lower), nausea and vomiting. Negative for diarrhea.  Genitourinary: Positive for dysuria, flank pain and hematuria. Negative for discharge, penile pain and scrotal swelling.  Musculoskeletal: Negative for myalgias.  Skin: Negative for rash.  Neurological: Negative for headaches.    Physical Exam Updated Vital Signs BP (!) 175/105 (BP Location: Left Arm)   Pulse 77   Temp 98.3 F (36.8 C) (Oral)   Resp 17   Ht 6' (1.829 m)   Wt 117.9 kg   SpO2 100%   BMI 35.26 kg/m   Physical Exam Vitals and nursing note reviewed.  Constitutional:      Appearance: He  is well-developed.  HENT:     Head: Normocephalic and atraumatic.  Eyes:     General:        Right eye: No discharge.        Left eye: No discharge.     Conjunctiva/sclera: Conjunctivae normal.  Cardiovascular:     Rate and Rhythm: Normal rate and regular rhythm.     Heart sounds: Normal heart sounds.  Pulmonary:     Effort: Pulmonary effort is normal.     Breath sounds: Normal breath sounds.  Abdominal:     Palpations: Abdomen is soft.     Tenderness: There is no abdominal tenderness.     Comments: Normal movement from sitting to lying in bed without guarding.   Genitourinary:    Testes: Normal.        Right: Tenderness or  swelling not present.        Left: Tenderness or swelling not present.  Musculoskeletal:     Cervical back: Normal range of motion and neck supple.  Skin:    General: Skin is warm and dry.  Neurological:     Mental Status: He is alert.     ED Results / Procedures / Treatments   Labs (all labs ordered are listed, but only abnormal results are displayed) Labs Reviewed  COMPREHENSIVE METABOLIC PANEL - Abnormal; Notable for the following components:      Result Value   Glucose, Bld 120 (*)    AST 48 (*)    ALT 74 (*)    All other components within normal limits  CBC - Abnormal; Notable for the following components:   WBC 14.0 (*)    All other components within normal limits  URINALYSIS, ROUTINE W REFLEX MICROSCOPIC - Abnormal; Notable for the following components:   Color, Urine RED (*)    APPearance TURBID (*)    Hgb urine dipstick LARGE (*)    Ketones, ur 5 (*)    Protein, ur 30 (*)    RBC / HPF >50 (*)    Bacteria, UA RARE (*)    All other components within normal limits  URINE CULTURE  LIPASE, BLOOD    EKG EKG Interpretation  Date/Time:  Monday December 13 2019 22:59:55 EDT Ventricular Rate:  95 PR Interval:    QRS Duration: 103 QT Interval:  336 QTC Calculation: 423 R Axis:   39 Text Interpretation: Sinus rhythm RSR' in V1 or V2, right VCD or RVH Nonspecific T abnormalities, diffuse leads Baseline wander in lead(s) I aVR aVL V1 V2 12 Lead; [place] No previous ECGs available Interpretation limited secondary to artifact Confirmed by Ripley Fraise [id number]) on 12/13/2019 11:29:31 PM   Radiology CT Renal Stone Study  Result Date: 12/14/2019 CLINICAL DATA:  Hematuria, flank pain. History of prostatitis EXAM: CT ABDOMEN AND PELVIS WITHOUT CONTRAST TECHNIQUE: Multidetector CT imaging of the abdomen and pelvis was performed following the standard protocol without IV contrast. COMPARISON:  None. FINDINGS: Lower chest: No acute abnormality. Hepatobiliary: Diffusely  decreased attenuation of the hepatic parenchyma compatible with hepatic steatosis. No focal hepatic lesion. There is a small amount of focal fatty sparing adjacent to the gallbladder fossa. Gallbladder appears unremarkable. No hyperdense gallstone. No biliary dilatation. Pancreas: Unremarkable. No pancreatic ductal dilatation or surrounding inflammatory changes. Spleen: Normal in size without focal abnormality. Adrenals/Urinary Tract: Unremarkable adrenal glands. Partially obstructing 10 x 4 mm stone within the distal left ureter just proximal to the ureterovesical junction resulting in mild left hydroureteronephrosis. Mild left periureteral and perinephric  stranding. No right-sided renal or ureteral calculi. No focal renal lesion. Urinary bladder is incompletely distended but appears unremarkable. Stomach/Bowel: Stomach is within normal limits. Appendix appears normal. Colonic diverticulosis. No evidence of bowel wall thickening, distention, or inflammatory changes. Vascular/Lymphatic: No significant vascular findings are present. No enlarged abdominal or pelvic lymph nodes. Reproductive: Prostate is unremarkable. Other: No abdominal wall hernia or abnormality. No abdominopelvic ascites. Musculoskeletal: No acute or significant osseous findings. IMPRESSION: 1. Partially obstructing 10 x 4 mm stone within the distal left ureter just proximal to the UVJ resulting in mild left hydroureteronephrosis. 2. Hepatic steatosis. 3. Colonic diverticulosis. Electronically Signed   By: Davina Poke D.O.   On: 12/14/2019 08:24    Procedures Procedures (including critical care time)  Medications Ordered in ED Medications  sodium chloride flush (NS) 0.9 % injection 3 mL (has no administration in time range)  ondansetron (ZOFRAN-ODT) disintegrating tablet 4 mg (4 mg Oral Given 12/13/19 2257)    ED Course  I have reviewed the triage vital signs and the nursing notes.  Pertinent labs & imaging results that were  available during my care of the patient were reviewed by me and considered in my medical decision making (see chart for details).  Patient seen and examined.  Patient states that his nausea is improved after administration of Zofran.  Currently no pain.  Patient looks nontoxic in appearance.  UA with obvious blood, questionable infection.  This may be recurrent prostatitis, however I feel be prudent to obtain renal protocol CT to rule out kidney stone or other areas of inflammation in the lower abdomen.  Patient agrees.  Previous urine cultures without any growth.  Vital signs reviewed and are as follows: BP (!) 175/105 (BP Location: Left Arm)   Pulse 77   Temp 98.3 F (36.8 C) (Oral)   Resp 17   Ht 6' (1.829 m)   Wt 117.9 kg   SpO2 100%   BMI 35.26 kg/m   9:03 AM CT reviewed personally. Patient with left ureteral stone.  I went and recheck patient. Currently he is asymptomatic and feels well. We (patient and wife) discussed CT findings including fatty liver and diverticulosis. Encouraged that he have these followed by his primary care doctor.  Patient counseled on kidney stone treatment. Urged patient to strain urine and save any stones. Urged urology follow-up and return to [facility] with any complications. Counseled patient to maintain good fluid intake.   Counseled patient on use of Flomax.   Patient counseled on use of narcotic pain medications. Counseled not to combine these medications with others containing tylenol. Urged not to drink alcohol, drive, or perform any other activities that requires focus while taking these medications. The patient verbalizes understanding and agrees with the plan.     MDM Rules/Calculators/A&P                      Patient with hematuria likely caused by left ureteral stone. This is a large stone at the UVJ. Patient has likely been dealing with this over the past several weeks. At this point I have low concern for infection given UA today with recent  negative urine culture. Patient had a severe episode of pain which prompted emergency department visit last night. This is likely why his white blood cell count is 14,000. Patient has been afebrile. Symptoms are now controlled. Patient with hepatic steatosis and slightly elevated liver function tests which will need to be rechecked by his doctor. Patient also  has diverticulosis but no concerns for diverticulitis today. We discussed signs and symptoms to return as above. Patient will strain his urine. He will be given urology follow-up and strongly encouraged to follow-up given the size of the stone and slow progression to this point.    Final Clinical Impression(s) / ED Diagnoses Final diagnoses:  Left ureteral stone  Hepatic steatosis  Diverticulosis of colon    Rx / DC Orders ED Discharge Orders         Ordered    tamsulosin (FLOMAX) 0.4 MG CAPS capsule  Daily     12/14/19 0900    oxyCODONE (ROXICODONE) 5 MG immediate release tablet  Every 4 hours PRN     12/14/19 0900    ondansetron (ZOFRAN ODT) 4 MG disintegrating tablet  Every 8 hours PRN     12/14/19 0900           Carlisle Cater, PA-C 12/14/19 [id number]    Valarie Merino, MD 12/19/19 [phone]

## 2019-12-15 DIAGNOSIS — R8271 Bacteriuria: Secondary | ICD-10-CM | POA: Diagnosis not present

## 2019-12-15 DIAGNOSIS — N201 Calculus of ureter: Secondary | ICD-10-CM | POA: Diagnosis not present

## 2019-12-15 LAB — URINE CULTURE: Culture: NO GROWTH

## 2019-12-20 MED FILL — TAMSULOSIN HCL 0.4 MG CAP: 0.4 | 30 days supply | Qty: 30 | Fill #0

## 2020-01-03 DIAGNOSIS — N201 Calculus of ureter: Secondary | ICD-10-CM | POA: Diagnosis not present

## 2020-01-27 ENCOUNTER — Other Ambulatory Visit (INDEPENDENT_AMBULATORY_CARE_PROVIDER_SITE_OTHER): Payer: 59

## 2020-01-27 DIAGNOSIS — Z0000 Encounter for general adult medical examination without abnormal findings: Secondary | ICD-10-CM

## 2020-01-27 LAB — CBC WITH DIFFERENTIAL/PLATELET
Basophils Absolute: 0 10*3/uL (ref 0.0–0.1)
Basophils Relative: 0.4 % (ref 0.0–3.0)
Eosinophils Absolute: 0.2 10*3/uL (ref 0.0–0.7)
Eosinophils Relative: 3.3 % (ref 0.0–5.0)
HCT: 43 % (ref 39.0–52.0)
Hemoglobin: 14.6 g/dL (ref 13.0–17.0)
Lymphocytes Relative: 28.5 % (ref 12.0–46.0)
Lymphs Abs: 1.5 10*3/uL (ref 0.7–4.0)
MCHC: 33.8 g/dL (ref 30.0–36.0)
MCV: 89.1 fl (ref 78.0–100.0)
Monocytes Absolute: 0.5 10*3/uL (ref 0.1–1.0)
Monocytes Relative: 9 % (ref 3.0–12.0)
Neutro Abs: 3 10*3/uL (ref 1.4–7.7)
Neutrophils Relative %: 58.8 % (ref 43.0–77.0)
Platelets: 194 10*3/uL (ref 150.0–400.0)
RBC: 4.83 Mil/uL (ref 4.22–5.81)
RDW: 13 % (ref 11.5–15.5)
WBC: 5.2 10*3/uL (ref 4.0–10.5)

## 2020-01-27 LAB — LIPID PANEL
Cholesterol: 209 mg/dL — ABNORMAL HIGH (ref 0–200)
HDL: 42.8 mg/dL (ref >39.00)
LDL Cholesterol: 135 mg/dL — ABNORMAL HIGH (ref 0–99)
NonHDL: 165.96
Total CHOL/HDL Ratio: 5
Triglycerides: 154 mg/dL — ABNORMAL HIGH (ref 0.0–149.0)
VLDL: 30.8 mg/dL (ref 0.0–40.0)

## 2020-01-27 LAB — URINALYSIS, ROUTINE W REFLEX MICROSCOPIC
Bilirubin Urine: NEGATIVE
Ketones, ur: NEGATIVE
Leukocytes,Ua: NEGATIVE
Nitrite: NEGATIVE
Specific Gravity, Urine: 1.01 (ref 1.000–1.030)
Total Protein, Urine: NEGATIVE
Urine Glucose: NEGATIVE
Urobilinogen, UA: 0.2 (ref 0.0–1.0)
pH: 7 (ref 5.0–8.0)

## 2020-01-27 LAB — BASIC METABOLIC PANEL
BUN: 12 mg/dL (ref 6–23)
CO2: 27 mEq/L (ref 19–32)
Calcium: 8.8 mg/dL (ref 8.4–10.5)
Chloride: 104 mEq/L (ref 96–112)
Creatinine, Ser: 0.79 mg/dL (ref 0.40–1.50)
GFR: 106.58 mL/min (ref >60.00)
Glucose, Bld: 92 mg/dL (ref 70–99)
Potassium: 4.1 mEq/L (ref 3.5–5.1)
Sodium: 138 mEq/L (ref 135–145)

## 2020-01-27 LAB — HEPATIC FUNCTION PANEL
ALT: 51 U/L (ref 0–53)
AST: 36 U/L (ref 0–37)
Albumin: 4.2 g/dL (ref 3.5–5.2)
Alkaline Phosphatase: 57 U/L (ref 39–117)
Bilirubin, Direct: 0.2 mg/dL (ref 0.0–0.3)
Total Bilirubin: 1.1 mg/dL (ref 0.2–1.2)
Total Protein: 6.6 g/dL (ref 6.0–8.3)

## 2020-01-27 LAB — TSH: TSH: 2.1 u[IU]/mL (ref 0.35–4.50)

## 2020-01-27 LAB — PSA: PSA: 0.46 ng/mL (ref 0.10–4.00)

## 2020-01-28 ENCOUNTER — Ambulatory Visit (INDEPENDENT_AMBULATORY_CARE_PROVIDER_SITE_OTHER): Payer: 59 | Admitting: Internal Medicine

## 2020-01-28 ENCOUNTER — Other Ambulatory Visit: Payer: Self-pay

## 2020-01-28 ENCOUNTER — Encounter: Payer: Self-pay | Admitting: Internal Medicine

## 2020-01-28 VITALS — BP 130/96 | HR 66 | Temp 97.9°F | Ht 72.0 in | Wt 262.0 lb

## 2020-01-28 DIAGNOSIS — E559 Vitamin D deficiency, unspecified: Secondary | ICD-10-CM

## 2020-01-28 DIAGNOSIS — E538 Deficiency of other specified B group vitamins: Secondary | ICD-10-CM | POA: Diagnosis not present

## 2020-01-28 DIAGNOSIS — Z0000 Encounter for general adult medical examination without abnormal findings: Secondary | ICD-10-CM | POA: Diagnosis not present

## 2020-01-28 NOTE — Assessment & Plan Note (Signed)

## 2020-01-28 NOTE — Progress Notes (Addendum)
   Subjective:    Patient ID: Charles Adams, male    DOB: 22-May-1976, 44 y.o.   MRN: [id number]  HPI  Here for wellness and f/u;  Overall doing ok;  Pt denies Chest pain, worsening SOB, DOE, wheezing, orthopnea, PND, worsening LE edema, palpitations, dizziness or syncope.  Pt denies neurological change such as new headache, facial or extremity weakness.  Pt denies polydipsia, polyuria, or low sugar symptoms. Pt states overall good compliance with treatment and medications, good tolerability, and has been trying to follow appropriate diet.  Pt denies worsening depressive symptoms, suicidal ideation or panic. No fever, night sweats, wt loss, loss of appetite, or other constitutional symptoms.  Pt states good ability with ADL's, has low fall risk, home safety reviewed and adequate, no other significant changes in hearing or vision, and only occasionally active with exercise.  No new complaints Past Medical History:  Diagnosis Date  . Allergic rhinitis, cause unspecified   . Numerous moles 12/15/2013   Past Surgical History:  Procedure Laterality Date  . eye surgury Right 44yo   right eye metal fragment removal    reports that he has never smoked. He has never used smokeless tobacco. He reports that he does not drink alcohol or use drugs. family history includes Arthritis in an other family member; Cancer in his father and another family member; Diabetes in an other family member; Heart disease in an other family member. No Known Allergies No current outpatient medications on file prior to visit.   No current facility-administered medications on file prior to visit.   Review of Systems All otherwise neg per pt     Objective:   Physical Exam BP (!) 130/96 (BP Location: Left Arm, Patient Position: Sitting, Cuff Size: Large)   Pulse 66   Temp 97.9 F (36.6 C) (Oral)   Ht 6' (1.829 m)   Wt 262 lb (118.8 kg)   SpO2 99%   BMI 35.53 kg/m  VS noted,  Constitutional: Pt appears in NAD HENT:  Head: NCAT.  Right Ear: External ear normal.  Left Ear: External ear normal.  Eyes: . Pupils are equal, round, and reactive to light. Conjunctivae and EOM are normal Nose: without d/c or deformity Neck: Neck supple. Gross normal ROM Cardiovascular: Normal rate and regular rhythm.   Pulmonary/Chest: Effort normal and breath sounds without rales or wheezing.  Abd:  Soft, NT, ND, + BS, no organomegaly Neurological: Pt is alert. At baseline orientation, motor grossly intact Skin: Skin is warm. No rashes, other new lesions, no LE edema Psychiatric: Pt behavior is normal without agitation  All otherwise neg per pt Lab Results  Component Value Date   WBC 5.2 01/27/2020   HGB 14.6 01/27/2020   HCT 43.0 01/27/2020   PLT 194.0 01/27/2020   GLUCOSE 92 01/27/2020   CHOL 209 (H) 01/27/2020   TRIG 154.0 (H) 01/27/2020   HDL 42.80 01/27/2020   LDLCALC 135 (H) 01/27/2020   ALT 51 01/27/2020   AST 36 01/27/2020   NA 138 01/27/2020   K 4.1 01/27/2020   CL 104 01/27/2020   CREATININE 0.79 01/27/2020   BUN 12 01/27/2020   CO2 27 01/27/2020   TSH 2.10 01/27/2020   PSA 0.46 01/27/2020      Assessment & Plan:

## 2020-01-28 NOTE — Patient Instructions (Signed)

## 2020-02-15 DIAGNOSIS — N23 Unspecified renal colic: Secondary | ICD-10-CM | POA: Diagnosis not present

## 2020-02-15 DIAGNOSIS — N201 Calculus of ureter: Secondary | ICD-10-CM | POA: Diagnosis not present

## 2020-02-15 MED FILL — TAMSULOSIN HCL 0.4 MG CAP: 0.4 | 30 days supply | Qty: 30 | Fill #0

## 2020-02-15 MED FILL — oxyCODONE HCL 5 MG TABS: 5 | 3 days supply | Qty: 15 | Fill #0

## 2020-02-15 MED FILL — ONDANSETRON HCL 4 MG TABS: 4 | 5 days supply | Qty: 20 | Fill #0

## 2020-02-21 ENCOUNTER — Other Ambulatory Visit: Payer: Self-pay | Admitting: Urology

## 2020-02-24 NOTE — Progress Notes (Signed)
Patient to arrive at 0600 on 02/28/20. History and medications reviewed. All pre-procedure instructions given. Driver secured.

## 2020-02-25 ENCOUNTER — Other Ambulatory Visit (HOSPITAL_COMMUNITY)
Admission: RE | Admit: 2020-02-25 | Discharge: 2020-02-25 | Disposition: A | Discharge status: Home / Self Care | Payer: 59 | Source: Ambulatory Visit | Attending: Urology | Admitting: Urology

## 2020-02-25 DIAGNOSIS — Z01812 Encounter for preprocedural laboratory examination: Secondary | ICD-10-CM | POA: Insufficient documentation

## 2020-02-25 DIAGNOSIS — Z20822 Contact with and (suspected) exposure to covid-19: Secondary | ICD-10-CM | POA: Insufficient documentation

## 2020-02-25 LAB — SARS CORONAVIRUS 2 (TAT 6-24 HRS): SARS Coronavirus 2: NEGATIVE

## 2020-02-25 NOTE — H&P (Signed)
Office Visit Report     02/15/2020   --------------------------------------------------------------------------------   Charles Adams  MRN: [id number]  DOB: [birth year], 44 year old Male  SSN:    PRIMARY CARE:  Cathlean Cower, MD  REFERRING:    PROVIDER:  Franchot Gallo, M.D.  TREATING:  Jiles Crocker, NP  LOCATION:  [facility] [phone]     --------------------------------------------------------------------------------   CC: I have ureteral stone.  HPI: Charles Adams is a 44 year-old male established patient who is here for ureteral stone.  4.14.2021: Initially seen for a 4x10 mm left ureteral stone. He states that he was symptomatic for around a week with urinary sx's of freq, urgency, and nocturia which is very unusual for him. He was treated as though he had an infection per his PCP but this did little to help his sx's. He states that his sx's continued for a few days with onset of severe pain and hematuria on 4.12.2021 which prompted his ER visit 4.13.2021. He was started on tamsulosin. CT at this hospital visit revealed stone present in UVJ.   5.3.2021: Here today for follow-up w/ KUB. No longer symptomatic. He denies having felt as though he has passed his stone and has not caught one in his strainer but he reports feeling much better than he did two months prior.   02/15/2020: Pt presents this morning as a work-in after developing acute left lower quadrant abdominal pain radiating into the lower back and associated pain/discomfort radiating across the entire lower back. Also associated with nausea and vomiting, some increased urgency, hesitancy/straining to initiate force of stream. He continues tamsulosin. I spoke to his wife over the phone and instructed him to take pain medication he had left over from initial stone diagnosis. He also took nausea medicine which did help alleviate his pain and discomfort as well as nausea/vomiting. Today still feeling a  little uncomfortable in the left lower quadrant but significantly improved from last night. He denies any visible blood in the urine or interval stone material passage. He denies fevers or chills.   The problem is on the left side. He first stated noticing pain on approximately 11/24/2019. He is currently having groin pain. He denies having flank pain, back pain, nausea, vomiting, fever, and chills. Pain is occuring on the left side. He has not caught a stone in his urine strainer since his symptoms began.     ALLERGIES: None   MEDICATIONS: Tamsulosin Hcl 0.4 mg capsule 1 capsule PO Daily  Ibuprofen  Ondansetron Hcl 4 mg tablet 1 tablet PO Q 6 H PRN For nausea/vomiting  Oxycodone Hcl 5 mg tablet 1 tablet PO Q 6 H PRN For severe pain     GU PSH: None   NON-GU PSH: None   GU PMH: Ureteral calculus, KUB today shows no obvious signs of stone. No longer symptomatic. - 01/03/2020, 4x10 mm stone present in left UVJ. Mild sx's currently of urinary freq and intermittent pain. He is already on tamsulosin. It is possible that he will pass this without need for surgical intervention., - 12/15/2019 History of urolithiasis    NON-GU PMH: None   FAMILY HISTORY: 1 Daughter - Runs in Family 1 son - Runs in Family Breast Cancer - Mother Heart Disease - Father Kidney Stones - Father pancreatic cancer - Father Prostate Cancer - Grandfather   SOCIAL HISTORY: Marital Status: Married Preferred Language: English; Ethnicity: Not Hispanic Or Latino; Race: White Current Smoking Status: Patient has never smoked.  Tobacco Use Assessment Completed: Used Tobacco in last 30 days? Does not drink anymore.  Drinks 1 caffeinated drink per day.    REVIEW OF SYSTEMS:    GU Review Male:   Patient denies frequent urination, hard to postpone urination, burning/ pain with urination, get up at night to urinate, leakage of urine, stream starts and stops, trouble starting your stream, have to strain to urinate , erection  problems, and penile pain.  Gastrointestinal (Upper):   Patient reports nausea and vomiting. Patient denies indigestion/ heartburn.  Gastrointestinal (Lower):   Patient denies diarrhea and constipation.  Constitutional:   Patient denies fever, night sweats, weight loss, and fatigue.  Skin:   Patient denies itching and skin rash/ lesion.  Eyes:   Patient denies blurred vision and double vision.  Ears/ Nose/ Throat:   Patient denies sore throat and sinus problems.  Hematologic/Lymphatic:   Patient denies swollen glands and easy bruising.  Cardiovascular:   Patient denies leg swelling and chest pains.  Respiratory:   Patient denies cough and shortness of breath.  Endocrine:   Patient denies excessive thirst.  Musculoskeletal:   Patient reports back pain. Patient denies joint pain.  Neurological:   Patient denies headaches and dizziness.  Psychologic:   Patient denies depression and anxiety.   VITAL SIGNS:      02/15/2020 09:26 AM  BP 136/92 mmHg  Pulse 88 /min  Temperature 98.4 F / 36.8 C   MULTI-SYSTEM PHYSICAL EXAMINATION:    Constitutional: Well-nourished. No physical deformities. Normally developed. Good grooming.  Neck: Neck symmetrical, not swollen. Normal tracheal position.  Respiratory: No labored breathing, no use of accessory muscles.   Skin: No paleness, no jaundice, no cyanosis. No lesion, no ulcer, no rash.  Neurologic / Psychiatric: Oriented to time, oriented to place, oriented to person. No depression, no anxiety, no agitation.  Gastrointestinal: No mass, no tenderness, no rigidity, non obese abdomen. No CVA or flank tenderness.  Musculoskeletal: Normal gait and station of head and neck.     Complexity of Data:  Source Of History:  Patient, Medical Record Summary  Records Review:   Previous Hospital Records, Previous Patient Records  Urine Test Review:   Urinalysis  X-Ray Review: KUB: Reviewed Films. Discussed With Patient.  [facility] Protocol: Reviewed Films.  Reviewed Report.     02/15/20  Urinalysis  Urine Appearance Clear   Urine Color Amber   Urine Glucose Neg mg/dL  Urine Bilirubin Neg mg/dL  Urine Ketones Neg mg/dL  Urine Specific Gravity 1.025   Urine Blood Trace ery/uL  Urine pH 5.5   Urine Protein Neg mg/dL  Urine Urobilinogen 0.2 mg/dL  Urine Nitrites Neg   Urine Leukocyte Esterase Neg leu/uL  Urine WBC/hpf 0 - 5/hpf   Urine RBC/hpf 3 - 10/hpf   Urine Epithelial Cells 0 - 5/hpf   Urine Bacteria Rare (0-9/hpf)   Urine Mucous Present   Urine Yeast NS (Not Seen)   Urine Trichomonas Not Present   Urine Cystals NS (Not Seen)   Urine Casts NS (Not Seen)   Urine Sperm Not Present    PROCEDURES:         KUB - 40981  A single view of the abdomen is obtained. A grossly 6 x 11 mm opacity consistent with a distal left ureteral calculi is identified on today's imaging study. No other obvious renal or ureteral calculi noted on today's exam. An arrow was drawn for better identification.      . Patient confirmed No Neulasta  OnPro Device.           Urinalysis w/Scope Dipstick Dipstick Cont'd Micro  Color: Amber Bilirubin: Neg mg/dL WBC/hpf: 0 - 5/hpf  Appearance: Clear Ketones: Neg mg/dL RBC/hpf: 3 - 10/hpf  Specific Gravity: 1.025 Blood: Trace ery/uL Bacteria: Rare (0-9/hpf)  pH: 5.5 Protein: Neg mg/dL Cystals: NS (Not Seen)  Glucose: Neg mg/dL Urobilinogen: 0.2 mg/dL Casts: NS (Not Seen)    Nitrites: Neg Trichomonas: Not Present    Leukocyte Esterase: Neg leu/uL Mucous: Present      Epithelial Cells: 0 - 5/hpf      Yeast: NS (Not Seen)      Sperm: Not Present    ASSESSMENT:      ICD-10 Details  1 GU:   Ureteral calculus - N20.1 Left, Acute, Systemic Symptoms  2   Renal colic - R48 Left, Undiagnosed New Problem   PLAN:            Medications Refill Meds: Tamsulosin Hcl 0.4 mg capsule 1 capsule PO Daily   #30  1 Refill(s)  Ondansetron Hcl 4 mg tablet 1 tablet PO Q 6 H PRN For nausea/vomiting  #20  0 Refill(s)   Oxycodone Hcl 5 mg tablet 1 tablet PO Q 6 H PRN For severe pain  #15  0 Refill(s)            Orders Labs Urine Culture  X-Rays: KUB          Schedule Return Visit/Planned Activity: Other See Visit Notes - Follow up MD, Schedule Surgery          Document Letter(s):  Created for Patient: Clinical Summary         Notes:   Quite symptomatic last night but symptoms have improved as of this morning. He continues tamsulosin but has had to rely on pain medication as well as antinausea medicine to lessen severity of pain and nausea/vomiting. Stone in question easily identifiable on today's KUB imaging study. Given the chronicity of symptoms and size of stone, I do not think he will have much success with continued medical expulsive therapy. I discussed with him in detail definitive intervention in the form of either shockwave lithotripsy versus ureteroscopy. If possible he would prefer the less invasive procedure. I will discuss this with his urologist before having the patient scheduled. Instructed to remain well hydrated, continue tamsulosin. I will refill pain medication as well as antinausea medication today to take with severe exacerbation of symptoms. Tylenol or ibuprofen can be used with mild-to-moderate pain or discomfort. He understands he will have to discontinue NSAIDs prior to lithotripsy if that is decided upon. He will contact the office in the event he does pass any stone material before next follow-up for procedure. Return to clinic instructions for worsening symptomatology discussed in detail as well.   *For shockwave lithotripsy I described the risks which include arrhythmia, kidney contusion, kidney hemorrhage, need for transfusion, long-term risk of diabetes or hypertension, back discomfort, flank ecchymosis, flank abrasion, inability to break up stone, inability to pass stone fragments, Steinstrasse, infection associated with obstructing stones, need for different surgical procedure  and possible need for repeat shockwave lithotripsy.   **For ureteroscopy I described the risks which include heart attack, stroke, pulmonary embolus, death, bleeding, infection, damage to contiguous structures, positioning injury, ureteral stricture, ureteral avulsion, ureteral injury, need for ureteral stent, inability to perform ureteroscopy, need for an interval procedure, inability to clear stone burden, stent discomfort and pain.    * Signed by Fritz Pickerel  Noberto Retort, NP on 02/15/20 at 5:15 PM (EDT)*       APPENDED NOTES:  Earlier this week I discussed treatment options with the patient regarding Dr Dahlstedt's recommendation. He wanted to think it over, discuss with his wife. I am still waiting to hear back from him regarding treatment decision, either proceeding with ESWL or URS.     * Signed by Jiles Crocker, NP on 02/17/20 at 3:34 PM (EDT)*

## 2020-02-28 ENCOUNTER — Encounter (HOSPITAL_BASED_OUTPATIENT_CLINIC_OR_DEPARTMENT_OTHER): Payer: Self-pay | Admitting: Urology

## 2020-02-28 ENCOUNTER — Ambulatory Visit (HOSPITAL_COMMUNITY): Payer: 59

## 2020-02-28 ENCOUNTER — Other Ambulatory Visit: Payer: Self-pay

## 2020-02-28 ENCOUNTER — Encounter (HOSPITAL_BASED_OUTPATIENT_CLINIC_OR_DEPARTMENT_OTHER): Payer: Self-pay | Admitting: Certified Registered"

## 2020-02-28 ENCOUNTER — Ambulatory Visit (HOSPITAL_BASED_OUTPATIENT_CLINIC_OR_DEPARTMENT_OTHER)
Admission: RE | Admit: 2020-02-28 | Discharge: 2020-02-28 | Disposition: A | Discharge status: Home / Self Care | Payer: 59 | Attending: Urology | Admitting: Urology

## 2020-02-28 ENCOUNTER — Encounter (HOSPITAL_BASED_OUTPATIENT_CLINIC_OR_DEPARTMENT_OTHER)
Admission: RE | Disposition: A | Discharge status: Home / Self Care | Payer: Self-pay | Source: Home / Self Care | Attending: Urology

## 2020-02-28 DIAGNOSIS — Z79899 Other long term (current) drug therapy: Secondary | ICD-10-CM | POA: Insufficient documentation

## 2020-02-28 DIAGNOSIS — N201 Calculus of ureter: Secondary | ICD-10-CM | POA: Insufficient documentation

## 2020-02-28 DIAGNOSIS — Z01818 Encounter for other preprocedural examination: Secondary | ICD-10-CM | POA: Diagnosis not present

## 2020-02-28 HISTORY — PX: EXTRACORPOREAL SHOCK WAVE LITHOTRIPSY: SHX1557

## 2020-02-28 SURGERY — LITHOTRIPSY, ESWL
Anesthesia: LOCAL | Laterality: Left

## 2020-02-28 MED ORDER — DIPHENHYDRAMINE HCL 25 MG PO CAPS
25.0000 mg | ORAL_CAPSULE | ORAL | Status: AC
  Administered 2020-02-28: 25 mg via ORAL

## 2020-02-28 MED ORDER — CIPROFLOXACIN HCL 500 MG PO TABS
500.0000 mg | ORAL_TABLET | ORAL | Status: AC
  Administered 2020-02-28: 500 mg via ORAL

## 2020-02-28 MED ORDER — HYDRALAZINE HCL 20 MG/ML IJ SOLN
5.0000 mg | Freq: Once | INTRAMUSCULAR | Status: AC
  Administered 2020-02-28: 5 mg via INTRAVENOUS

## 2020-02-28 MED ORDER — SODIUM CHLORIDE 0.9 % IV SOLN: INTRAVENOUS | Status: DC

## 2020-02-28 MED ORDER — CIPROFLOXACIN HCL 500 MG PO TABS
ORAL_TABLET | ORAL | Status: AC
  Filled 2020-02-28: qty 1

## 2020-02-28 MED ORDER — DIAZEPAM 5 MG PO TABS
10.0000 mg | ORAL_TABLET | ORAL | Status: AC
  Administered 2020-02-28: 10 mg via ORAL

## 2020-02-28 MED ORDER — HYDRALAZINE HCL 20 MG/ML IJ SOLN
INTRAMUSCULAR | Status: AC
  Filled 2020-02-28: qty 1

## 2020-02-28 MED ORDER — DIAZEPAM 5 MG PO TABS
ORAL_TABLET | ORAL | Status: AC
  Filled 2020-02-28: qty 2

## 2020-02-28 MED ORDER — DIPHENHYDRAMINE HCL 25 MG PO CAPS
ORAL_CAPSULE | ORAL | Status: AC
  Filled 2020-02-28: qty 1

## 2020-02-28 NOTE — Interval H&P Note (Signed)
History and Physical Interval Note:  02/28/2020 7:05 AM  Charles Adams  has presented today for surgery, with the diagnosis of LEFT URETERAL CALCULI.  The various methods of treatment have been discussed with the patient and family. After consideration of risks, benefits and other options for treatment, the patient has consented to  Procedure(s): EXTRACORPOREAL SHOCK WAVE LITHOTRIPSY (ESWL) (Left) as a surgical intervention.  The patient's history has been reviewed, patient examined, no change in status, stable for surgery.  I have reviewed the patient's chart and labs.  Questions were answered to the patient's satisfaction.     [employer]

## 2020-02-28 NOTE — Discharge Instructions (Addendum)
1. You should strain your urine and collect all fragments and bring them to your follow up appointment.  °2. You should take your pain medication as needed.  Please call if your pain is severe to the point that it is not controlled with your pain medication. °3. You should call if you develop fever > 101 or persistent nausea or vomiting. °4. Your doctor may prescribe tamsulosin to take to help facilitate stone passage. °

## 2020-02-28 NOTE — Op Note (Signed)
See [facility] operative note scanned into chart. Also because of the size, density, location and other factors that cannot be anticipated I feel this will likely be a staged procedure. This fact supersedes any indication in the scanned [facility] operative note to the contrary.  

## 2020-02-29 ENCOUNTER — Encounter (HOSPITAL_BASED_OUTPATIENT_CLINIC_OR_DEPARTMENT_OTHER): Payer: Self-pay | Admitting: Urology

## 2020-03-01 ENCOUNTER — Other Ambulatory Visit: Payer: Self-pay | Admitting: Family

## 2020-03-01 ENCOUNTER — Encounter: Payer: Self-pay | Admitting: Internal Medicine

## 2020-03-01 ENCOUNTER — Other Ambulatory Visit: Payer: Self-pay

## 2020-03-01 ENCOUNTER — Encounter: Payer: Self-pay | Admitting: Family

## 2020-03-01 ENCOUNTER — Telehealth: Payer: Self-pay

## 2020-03-01 ENCOUNTER — Ambulatory Visit (INDEPENDENT_AMBULATORY_CARE_PROVIDER_SITE_OTHER): Payer: 59 | Admitting: Family

## 2020-03-01 VITALS — BP 142/102 | HR 81 | Temp 98.7°F

## 2020-03-01 DIAGNOSIS — I10 Essential (primary) hypertension: Secondary | ICD-10-CM

## 2020-03-01 DIAGNOSIS — R9431 Abnormal electrocardiogram [ECG] [EKG]: Secondary | ICD-10-CM | POA: Diagnosis not present

## 2020-03-01 MED ORDER — LOSARTAN POTASSIUM 50 MG PO TABS
50.0000 mg | ORAL_TABLET | Freq: Every day | ORAL | 0 refills | Status: DC
Start: 2020-03-01 — End: 2020-03-31

## 2020-03-01 MED FILL — LOSARTAN POTASSIUM 50 MG TA: 50 | 90 days supply | Qty: 90 | Fill #0

## 2020-03-01 NOTE — Progress Notes (Signed)
Charles Adams is a 44 y.o. male with the following history as recorded in [employer]:  Patient Active Problem List   Diagnosis Date Noted  . Traumatic compression fracture of lumbar vertebra ([place]) 01/21/2017  . Hyperlipidemia 01/21/2017  . Preventative health care 12/15/2013  . Numerous moles 12/15/2013  . Allergic rhinitis   . Acute meniscal tear, medial 01/18/2011  . ANKLE INSTABILITY 01/30/2010    Current Outpatient Medications  Medication Sig Dispense Refill  . oxycodone (OXY-IR) 5 MG capsule Take 5 mg by mouth every 6 (six) hours as needed.    . tamsulosin (FLOMAX) 0.4 MG CAPS capsule Take 0.4 mg by mouth daily.    Marland Kitchen losartan (COZAAR) 50 MG tablet Take 1 tablet (50 mg total) by mouth daily. 90 tablet 0   No current facility-administered medications for this visit.    Allergies: Patient has no known allergies.  Past Medical History:  Diagnosis Date  . Allergic rhinitis, cause unspecified   . Numerous moles 12/15/2013    Past Surgical History:  Procedure Laterality Date  . EXTRACORPOREAL SHOCK WAVE LITHOTRIPSY Left 02/28/2020   Procedure: EXTRACORPOREAL SHOCK WAVE LITHOTRIPSY (ESWL);  Surgeon: Raynelle Bring, MD;  [facility]: [facility];  Service: Urology;  Laterality: Left;  . eye surgury Right 44yo   right eye metal fragment removal  . WISDOM TOOTH EXTRACTION      Family History  Problem Relation Age of Onset  . Cancer Father        pancreatic cancer  . Heart disease Other   . Diabetes Other   . Arthritis Other   . Cancer Other        lung cancer    Social History   Tobacco Use  . Smoking status: Never Smoker  . Smokeless tobacco: Never Used  Substance Use Topics  . Alcohol use: No    Subjective:  Patient had lithotripsy earlier this week and was told by urologist that he needed to follow regarding his blood pressure. Patient has been monitoring and notes it was 170/109 at home this morning- now down to 142/102 in office; no prior history of  hypertension but in reviewing his past readings, he has consistently had high readings; he notes that when he checks at home, his pressure averages around 130-140/95. Denies any chest pain, shortness of breath, blurred vision or headache FH is + for hypertension in mother, father and brother ( age 52);   Objective:  Vitals:   03/01/20 0958  BP: (!) 142/102  Pulse: 81  Temp: 98.7 F (37.1 C)  TempSrc: Oral  SpO2: 97%    General: Well developed, well nourished, in no acute distress  Skin : Warm and dry.  Head: Normocephalic and atraumatic  Lungs: Respirations unlabored; clear to auscultation bilaterally without wheeze, rales, rhonchi  CVS exam: normal rate and regular rhythm.  Neurologic: Alert and oriented; speech intact; face symmetrical; moves all extremities well; CNII-XII intact without focal deficit   Assessment:  1. Essential hypertension   2. Abnormal EKG     Plan:  EKG shows NSR with nonspecific ST/ T wave changes; will go ahead and refer to cardiology to get baseline stress test; go ahead and start Losartan 50 mg daily- risks and benefits of medication discussed; DASH diet discussed; he will follow-up with his PCP in 1 month- he is to bring home log and cuff for comparison.   This visit occurred during the SARS-CoV-2 public health emergency.  Safety protocols were in place, including screening questions  prior to the visit, additional usage of staff PPE, and extensive cleaning of exam room while observing appropriate contact time as indicated for disinfecting solutions.      Return in about 1 month (around 03/31/2020), or with Dr. John/ blood pressure check.  Orders Placed This Encounter  Procedures  . Ambulatory referral to Cardiology    Referral Priority:   Routine    Referral Type:   Consultation    Referral Reason:   Specialty Services Required    Requested Specialty:   Cardiology    Number of Visits Requested:   1  . EKG 12-Lead    Requested Prescriptions    Signed Prescriptions Disp Refills  . losartan (COZAAR) 50 MG tablet 90 tablet 0    Sig: Take 1 tablet (50 mg total) by mouth daily.

## 2020-03-01 NOTE — Telephone Encounter (Signed)
Per Charles Adams states his BP is 170/110 at 0600. Does not take BP meds. Lithotripsy on Mon and found to have HTN.  Advised to see PCP within 24 hours.  Patient seen by Jodi Mourning on 03/01/2020

## 2020-03-01 NOTE — Telephone Encounter (Signed)
New message    The patient C/o elevated blood pressure this morning 170/110   The patient is aware the call will be transfer to [facility] triage nurse assessment.  Call transfer & spoke with Estill Bamberg.

## 2020-03-01 NOTE — Telephone Encounter (Signed)
Sent to Dr. John. 

## 2020-03-01 NOTE — Patient Instructions (Signed)
DASH Eating Plan DASH stands for "Dietary Approaches to Stop Hypertension." The DASH eating plan is a healthy eating plan that has been shown to reduce high blood pressure (hypertension). It may also reduce your risk for type 2 diabetes, heart disease, and stroke. The DASH eating plan may also help with weight loss. What are tips for following this plan?  General guidelines  Avoid eating more than 2,300 mg (milligrams) of salt (sodium) a day. If you have hypertension, you may need to reduce your sodium intake to 1,500 mg a day.  Limit alcohol intake to no more than 1 drink a day for nonpregnant women and 2 drinks a day for men. One drink equals 12 oz of beer, 5 oz of wine, or 1 oz of hard liquor.  Work with your health care provider to maintain a healthy body weight or to lose weight. Ask what an ideal weight is for you.  Get at least 30 minutes of exercise that causes your heart to beat faster (aerobic exercise) most days of the week. Activities may include walking, swimming, or biking.  Work with your health care provider or diet and nutrition specialist (dietitian) to adjust your eating plan to your individual calorie needs. Reading food labels   Check food labels for the amount of sodium per serving. Choose foods with less than 5 percent of the Daily Value of sodium. Generally, foods with less than 300 mg of sodium per serving fit into this eating plan.  To find whole grains, look for the word "whole" as the first word in the ingredient list. Shopping  Buy products labeled as "low-sodium" or "no salt added."  Buy fresh foods. Avoid canned foods and premade or frozen meals. Cooking  Avoid adding salt when cooking. Use salt-free seasonings or herbs instead of table salt or sea salt. Check with your health care provider or pharmacist before using salt substitutes.  Do not fry foods. Cook foods using healthy methods such as baking, boiling, grilling, and broiling instead.  Cook with  heart-healthy oils, such as olive, canola, soybean, or sunflower oil. Meal planning  Eat a balanced diet that includes: ? 5 or more servings of fruits and vegetables each day. At each meal, try to fill half of your plate with fruits and vegetables. ? Up to 6-8 servings of whole grains each day. ? Less than 6 oz of lean meat, poultry, or fish each day. A 3-oz serving of meat is about the same size as a deck of cards. One egg equals 1 oz. ? 2 servings of low-fat dairy each day. ? A serving of nuts, seeds, or beans 5 times each week. ? Heart-healthy fats. Healthy fats called Omega-3 fatty acids are found in foods such as flaxseeds and coldwater fish, like sardines, salmon, and mackerel.  Limit how much you eat of the following: ? Canned or prepackaged foods. ? Food that is high in trans fat, such as fried foods. ? Food that is high in saturated fat, such as fatty meat. ? Sweets, desserts, sugary drinks, and other foods with added sugar. ? Full-fat dairy products.  Do not salt foods before eating.  Try to eat at least 2 vegetarian meals each week.  Eat more home-cooked food and less restaurant, buffet, and fast food.  When eating at a restaurant, ask that your food be prepared with less salt or no salt, if possible. What foods are recommended? The items listed may not be a complete list. Talk with your dietitian about   what dietary choices are best for you. Grains Whole-grain or whole-wheat bread. Whole-grain or whole-wheat pasta. Brown rice. Oatmeal. Quinoa. Bulgur. Whole-grain and low-sodium cereals. Pita bread. Low-fat, low-sodium crackers. Whole-wheat flour tortillas. Vegetables Fresh or frozen vegetables (raw, steamed, roasted, or grilled). Low-sodium or reduced-sodium tomato and vegetable juice. Low-sodium or reduced-sodium tomato sauce and tomato paste. Low-sodium or reduced-sodium canned vegetables. Fruits All fresh, dried, or frozen fruit. Canned fruit in natural juice (without  added sugar). Meat and other protein foods Skinless chicken or turkey. Ground chicken or turkey. Pork with fat trimmed off. Fish and seafood. Egg whites. Dried beans, peas, or lentils. Unsalted nuts, nut butters, and seeds. Unsalted canned beans. Lean cuts of beef with fat trimmed off. Low-sodium, lean deli meat. Dairy Low-fat (1%) or fat-free (skim) milk. Fat-free, low-fat, or reduced-fat cheeses. Nonfat, low-sodium ricotta or cottage cheese. Low-fat or nonfat yogurt. Low-fat, low-sodium cheese. Fats and oils Soft margarine without trans fats. Vegetable oil. Low-fat, reduced-fat, or light mayonnaise and salad dressings (reduced-sodium). Canola, safflower, olive, soybean, and sunflower oils. Avocado. Seasoning and other foods Herbs. Spices. Seasoning mixes without salt. Unsalted popcorn and pretzels. Fat-free sweets. What foods are not recommended? The items listed may not be a complete list. Talk with your dietitian about what dietary choices are best for you. Grains Baked goods made with fat, such as croissants, muffins, or some breads. Dry pasta or rice meal packs. Vegetables Creamed or fried vegetables. Vegetables in a cheese sauce. Regular canned vegetables (not low-sodium or reduced-sodium). Regular canned tomato sauce and paste (not low-sodium or reduced-sodium). Regular tomato and vegetable juice (not low-sodium or reduced-sodium). Pickles. Olives. Fruits Canned fruit in a light or heavy syrup. Fried fruit. Fruit in cream or butter sauce. Meat and other protein foods Fatty cuts of meat. Ribs. Fried meat. Bacon. Sausage. Bologna and other processed lunch meats. Salami. Fatback. Hotdogs. Bratwurst. Salted nuts and seeds. Canned beans with added salt. Canned or smoked fish. Whole eggs or egg yolks. Chicken or turkey with skin. Dairy Whole or 2% milk, cream, and half-and-half. Whole or full-fat cream cheese. Whole-fat or sweetened yogurt. Full-fat cheese. Nondairy creamers. Whipped toppings.  Processed cheese and cheese spreads. Fats and oils Butter. Stick margarine. Lard. Shortening. Ghee. Bacon fat. Tropical oils, such as coconut, palm kernel, or palm oil. Seasoning and other foods Salted popcorn and pretzels. Onion salt, garlic salt, seasoned salt, table salt, and sea salt. Worcestershire sauce. Tartar sauce. Barbecue sauce. Teriyaki sauce. Soy sauce, including reduced-sodium. Steak sauce. Canned and packaged gravies. Fish sauce. Oyster sauce. Cocktail sauce. Horseradish that you find on the shelf. Ketchup. Mustard. Meat flavorings and tenderizers. Bouillon cubes. Hot sauce and Tabasco sauce. Premade or packaged marinades. Premade or packaged taco seasonings. Relishes. Regular salad dressings. Where to find more information:  National Heart, Lung, and Blood Institute: www.nhlbi.nih.gov  American Heart Association: www.heart.org Summary  The DASH eating plan is a healthy eating plan that has been shown to reduce high blood pressure (hypertension). It may also reduce your risk for type 2 diabetes, heart disease, and stroke.  With the DASH eating plan, you should limit salt (sodium) intake to 2,300 mg a day. If you have hypertension, you may need to reduce your sodium intake to 1,500 mg a day.  When on the DASH eating plan, aim to eat more fresh fruits and vegetables, whole grains, lean proteins, low-fat dairy, and heart-healthy fats.  Work with your health care provider or diet and nutrition specialist (dietitian) to adjust your eating plan to your   individual calorie needs. This information is not intended to replace advice given to you by your health care provider. Make sure you discuss any questions you have with your health care provider. Document Revised: 08/01/2017 Document Reviewed: 08/12/2016 Elsevier Patient Education  2020 Elsevier Inc.  

## 2020-03-13 DIAGNOSIS — N201 Calculus of ureter: Secondary | ICD-10-CM | POA: Diagnosis not present

## 2020-03-13 MED FILL — oxyCODONE HCL 5 MG TABS: 5 | 3 days supply | Qty: 15 | Fill #0

## 2020-03-13 MED FILL — TAMSULOSIN HCL 0.4 MG CAP: 0.4 | 60 days supply | Qty: 60 | Fill #0

## 2020-03-28 NOTE — Progress Notes (Signed)
Cardiology Office Note:   Date:  03/29/2020  NAME:  Charles Adams    MRN: [id number] DOB:  1976/02/18   PCP:  Biagio Borg, MD  Cardiologist:  No primary care provider on file.  Electrophysiologist:  None   Referring MD: Marrian Salvage,*   Chief Complaint  Patient presents with  . Abnormal ECG   History of Present Illness:   Charles Adams is a 44 y.o. male with a hx of HLD who is being seen today for the evaluation of abnormal EKG at the request of Marrian Salvage,*. Recent evaluation by PCP with concerns for abnormal EKG  And sent for evaluation.  His EKG today demonstrates normal sinus rhythm without any really acute changes.  He reports that he was recently diagnosed with high blood pressure in the setting of several kidney stones.  His primary care physician wanted him evaluated for this region.  Apparently there is a family history of heart disease in his father.  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  He walks all day on his feet lifting heavy objects without any chest pain or shortness of breath.  He reports no palpitations.  His most recent lipid profile is below.  He was started on losartan 50 mg daily and his blood pressure today is 129/77.  He is a never smoker.  He does not consume alcohol.  No illicit drug use reported.  He reports he does not exercise routinely but gets plenty working as a Dealer for [employer].  His cardiovascular exam is benign.  Overall he appears to be in fairly good health.  T chol 209, HDL 42, LDL 135, TG 154  Past Medical History: Past Medical History:  Diagnosis Date  . Allergic rhinitis, cause unspecified   . Hypertension   . Numerous moles 12/15/2013    Past Surgical History: Past Surgical History:  Procedure Laterality Date  . EXTRACORPOREAL SHOCK WAVE LITHOTRIPSY Left 02/28/2020   Procedure: EXTRACORPOREAL SHOCK WAVE LITHOTRIPSY (ESWL);  Surgeon: Raynelle Bring, MD;  Location: [facility];  Service: Urology;  Laterality: Left;  . eye surgury Right 44yo   right eye metal fragment removal  . WISDOM TOOTH EXTRACTION      Current Medications: Current Meds  Medication Sig  . losartan (COZAAR) 50 MG tablet Take 1 tablet (50 mg total) by mouth daily.  Marland Kitchen oxycodone (OXY-IR) 5 MG capsule Take 5 mg by mouth every 6 (six) hours as needed.  . tamsulosin (FLOMAX) 0.4 MG CAPS capsule Take 0.4 mg by mouth daily.     Allergies:    Patient has no known allergies.   Social History: Social History   Socioeconomic History  . Marital status: Married    Spouse name: Not on file  . Number of children: 2  . Years of education: college  . Highest education level: Not on file  Occupational History  . Occupation: Dealer, Hospital    Employer: [employer]  Tobacco Use  . Smoking status: Never Smoker  . Smokeless tobacco: Never Used  Substance and Sexual Activity  . Alcohol use: No  . Drug use: No  . Sexual activity: Not on file  Other Topics Concern  . Not on file  Social History Narrative  . Not on file   Social Determinants of Health   Financial Resource Strain:   . Difficulty of Paying Living Expenses:   Food Insecurity:   . Worried About Estate manager/land agent  of Food in the Last Year:   . [place] in the Last Year:   Transportation Needs:   . Lack of Transportation (Medical):   Marland Kitchen Lack of Transportation (Non-Medical):   Physical Activity:   . Days of Exercise per Week:   . Minutes of Exercise per Session:   Stress:   . Feeling of Stress :   Social Connections:   . Frequency of Communication with Friends and Family:   . Frequency of Social Gatherings with Friends and Family:   . Attends Religious Services:   . Active Member of Clubs or Organizations:   . Attends Archivist Meetings:   Marland Kitchen Marital Status:      Family History: The patient's family history includes Arthritis in an other family member; Cancer in his father and another family member;  Diabetes in an other family member; Heart disease in an other family member; Hypertension in his father and mother.  ROS:   All other ROS reviewed and negative. Pertinent positives noted in the HPI.     EKGs/Labs/Other Studies Reviewed:   The following studies were personally reviewed by me today:  EKG:  EKG is ordered today.  The ekg ordered today demonstrates normal sinus rhythm with sinus arrhythmia, heart rate 76, no acute ST-T changes, and was personally reviewed by me.   Recent Labs: 01/27/2020: ALT 51; BUN 12; Creatinine, Ser 0.79; Hemoglobin 14.6; Platelets 194.0; Potassium 4.1; Sodium 138; TSH 2.10   Recent Lipid Panel    Component Value Date/Time   CHOL 209 (H) 01/27/2020 0727   TRIG 154.0 (H) 01/27/2020 0727   HDL 42.80 01/27/2020 0727   CHOLHDL 5 01/27/2020 0727   VLDL 30.8 01/27/2020 0727   LDLCALC 135 (H) 01/27/2020 0727    Physical Exam:   VS:  BP (!) 129/77   Pulse 76   Ht 6' (1.829 m)   Wt (!) 265 lb 6.4 oz (120.4 kg)   SpO2 100%   BMI 35.99 kg/m    Wt Readings from Last 3 Encounters:  03/29/20 (!) 265 lb 6.4 oz (120.4 kg)  02/28/20 257 lb (116.6 kg)  01/28/20 262 lb (118.8 kg)    General: Well nourished, well developed, in no acute distress Heart: Atraumatic, normal size  Eyes: PEERLA, EOMI  Neck: Supple, no JVD Endocrine: No thryomegaly Cardiac: Normal S1, S2; RRR; no murmurs, rubs, or gallops Lungs: Clear to auscultation bilaterally, no wheezing, rhonchi or rales  Abd: Soft, nontender, no hepatomegaly  Ext: No edema, pulses 2+ Musculoskeletal: No deformities, BUE and BLE strength normal and equal Skin: Warm and dry, no rashes   Neuro: Alert and oriented to person, place, time, and situation, CNII-XII grossly intact, no focal deficits  Psych: Normal mood and affect   ASSESSMENT:   Charles Adams is a 44 y.o. male who presents for the following: 1. Nonspecific abnormal electrocardiogram (ECG) (EKG)   2. Hyperlipidemia, unspecified hyperlipidemia  type   3. Essential hypertension     PLAN:   1. Nonspecific abnormal electrocardiogram (ECG) (EKG) 2. Hyperlipidemia, unspecified hyperlipidemia type 3. Essential hypertension -EKG today is normal.  He has no evidence of cardiovascular disease on examination.  His blood pressure is well controlled on losartan 50 mg daily, 129/77.  He maintains a high level of activity working as a Dealer for the [employer].  He describes no chest pain or shortness of breath with walking all day.  He has no issues climbing several flights of stairs.  I see no need for cardiac stress test at this point.  Things look good.  His blood pressure also was noted to be elevated in the setting of recent kidney stones.  It may be prudent just to give him some time to heal from his kidney stones before aggressive blood pressure medications were added.  If they are I would recommend to add a calcium channel blocker or diuretic next.  We did discuss several ways to reduce his blood pressure including avoidance of salt and processed foods.  He also will work on exercising and dieting.  I think weight loss will be a good strategy to lower his blood pressure as well.  Overall, given his good state of health and normal EKG we will see him on an as-needed basis.  Disposition: Return if symptoms worsen or fail to improve.  Medication Adjustments/Labs and Tests Ordered: Current medicines are reviewed at length with the patient today.  Concerns regarding medicines are outlined above.  No orders of the defined types were placed in this encounter.  No orders of the defined types were placed in this encounter.   Patient Instructions  Medication Instructions:  The current medical regimen is effective;  continue present plan and medications.  *If you need a refill on your cardiac medications before your next appointment, please call your pharmacy*   Follow-Up: At [facility], you and your health needs are our priority.   As part of our continuing mission to provide you with exceptional heart care, we have created designated Provider Care Teams.  These Care Teams include your primary Cardiologist (physician) and Advanced Practice Providers (APPs -  Physician Assistants and Nurse Practitioners) who all work together to provide you with the care you need, when you need it.  We recommend signing up for the patient portal called "MyChart".  Sign up information is provided on this After Visit Summary.  MyChart is used to connect with patients for Virtual Visits (Telemedicine).  Patients are able to view lab/test results, encounter notes, upcoming appointments, etc.  Non-urgent messages can be sent to your provider as well.   To learn more about what you can do with MyChart, go to [email or web address].    Your next appointment:   As needed  The format for your next appointment:   In Person  Provider:   Eleonore Chiquito, MD        Signed, Addison Naegeli. Audie Box, [place]  [address], [place], [place] [address] [phone]  03/29/2020 4:01 PM

## 2020-03-29 ENCOUNTER — Other Ambulatory Visit: Payer: Self-pay

## 2020-03-29 ENCOUNTER — Encounter: Payer: Self-pay | Admitting: Cardiovascular Disease

## 2020-03-29 ENCOUNTER — Ambulatory Visit (INDEPENDENT_AMBULATORY_CARE_PROVIDER_SITE_OTHER): Payer: 59 | Admitting: Cardiovascular Disease

## 2020-03-29 VITALS — BP 129/77 | HR 76 | Ht 72.0 in | Wt 265.4 lb

## 2020-03-29 DIAGNOSIS — E785 Hyperlipidemia, unspecified: Secondary | ICD-10-CM | POA: Diagnosis not present

## 2020-03-29 DIAGNOSIS — R9431 Abnormal electrocardiogram [ECG] [EKG]: Secondary | ICD-10-CM | POA: Diagnosis not present

## 2020-03-29 DIAGNOSIS — I10 Essential (primary) hypertension: Secondary | ICD-10-CM | POA: Diagnosis not present

## 2020-03-29 NOTE — Patient Instructions (Signed)
Medication Instructions:  The current medical regimen is effective;  continue present plan and medications.  *If you need a refill on your cardiac medications before your next appointment, please call your pharmacy*    Follow-Up: At [facility], you and your health needs are our priority.  As part of our continuing mission to provide you with exceptional heart care, we have created designated Provider Care Teams.  These Care Teams include your primary Cardiologist (physician) and Advanced Practice Providers (APPs -  Physician Assistants and Nurse Practitioners) who all work together to provide you with the care you need, when you need it.  We recommend signing up for the patient portal called "MyChart".  Sign up information is provided on this After Visit Summary.  MyChart is used to connect with patients for Virtual Visits (Telemedicine).  Patients are able to view lab/test results, encounter notes, upcoming appointments, etc.  Non-urgent messages can be sent to your provider as well.   To learn more about what you can do with MyChart, go to [email or web address].    Your next appointment:   As needed  The format for your next appointment:   In Person  Provider:   Carson O'Neal, MD      

## 2020-03-31 ENCOUNTER — Encounter: Payer: Self-pay | Admitting: Internal Medicine

## 2020-03-31 ENCOUNTER — Other Ambulatory Visit: Payer: Self-pay

## 2020-03-31 ENCOUNTER — Ambulatory Visit: Payer: 59 | Admitting: Internal Medicine

## 2020-03-31 VITALS — BP 138/90 | HR 100 | Temp 98.9°F | Ht 72.0 in | Wt 264.0 lb

## 2020-03-31 DIAGNOSIS — I10 Essential (primary) hypertension: Secondary | ICD-10-CM

## 2020-03-31 DIAGNOSIS — E785 Hyperlipidemia, unspecified: Secondary | ICD-10-CM

## 2020-03-31 DIAGNOSIS — J309 Allergic rhinitis, unspecified: Secondary | ICD-10-CM

## 2020-03-31 MED ORDER — LOSARTAN POTASSIUM-HCTZ 100-12.5 MG PO TABS: 1.0000 | ORAL_TABLET | Freq: Every day | ORAL | 3 refills | Status: DC

## 2020-03-31 MED FILL — LOSARTAN-HCTZ 100-12.5 MG T: 100-12.5 | 90 days supply | Qty: 90 | Fill #0

## 2020-03-31 NOTE — Progress Notes (Signed)
Subjective:    Patient ID: Charles Adams, male    DOB: October 03, 1975, 44 y.o.   MRN: [id number]  HPI  Here to f/u; overall doing ok,  Pt denies chest pain, increasing sob or doe, wheezing, orthopnea, PND, increased LE swelling, palpitations, dizziness or syncope.  Pt denies new neurological symptoms such as new headache, or facial or extremity weakness or numbness.  Pt denies polydipsia, polyuria, or low sugar episode.  Pt states overall good compliance with meds, mostly trying to follow appropriate diet, with wt overall increasing due to excess calories.  Wt Readings from Last 3 Encounters:  03/31/20 (!) 264 lb (119.7 kg)  03/29/20 (!) 265 lb 6.4 oz (120.4 kg)  02/28/20 257 lb (116.6 kg)  BP increasing as well BP Readings from Last 3 Encounters:  03/31/20 (!) 138/90  03/29/20 (!) 129/77  03/01/20 (!) 142/102  Does have several wks ongoing nasal allergy symptoms with clearish congestion, itch and sneezing, without fever, pain, ST, cough, swelling or wheezing. Past Medical History:  Diagnosis Date  . Allergic rhinitis, cause unspecified   . Hypertension   . Numerous moles 12/15/2013   Past Surgical History:  Procedure Laterality Date  . EXTRACORPOREAL SHOCK WAVE LITHOTRIPSY Left 02/28/2020   Procedure: EXTRACORPOREAL SHOCK WAVE LITHOTRIPSY (ESWL);  Surgeon: Raynelle Bring, MD;  Location: [facility];  Service: Urology;  Laterality: Left;  . eye surgury Right 44yo   right eye metal fragment removal  . WISDOM TOOTH EXTRACTION      reports that he has never smoked. He has never used smokeless tobacco. He reports that he does not drink alcohol and does not use drugs. family history includes Arthritis in an other family member; Cancer in his father and another family member; Diabetes in an other family member; Heart disease in an other family member; Hypertension in his father and mother. No Known Allergies Current Outpatient Medications on File Prior to Visit  Medication Sig  Dispense Refill  . oxycodone (OXY-IR) 5 MG capsule Take 5 mg by mouth every 6 (six) hours as needed.    . tamsulosin (FLOMAX) 0.4 MG CAPS capsule Take 0.4 mg by mouth daily.     No current facility-administered medications on file prior to visit.   Review of Systems All otherwise neg per pt     Objective:   Physical Exam BP (!) 138/90 (BP Location: Left Arm, Patient Position: Sitting, Cuff Size: Large)   Pulse 100   Temp 98.9 F (37.2 C) (Oral)   Ht 6' (1.829 m)   Wt (!) 264 lb (119.7 kg)   SpO2 98%   BMI 35.80 kg/m  VS noted,  Constitutional: Pt appears in NAD HENT: Head: NCAT.  Right Ear: External ear normal.  Left Ear: External ear normal.  Eyes: . Pupils are equal, round, and reactive to light. Conjunctivae and EOM are normal Nose: without d/c or deformity Neck: Neck supple. Gross normal ROM Cardiovascular: Normal rate and regular rhythm.   Pulmonary/Chest: Effort normal and breath sounds without rales or wheezing.  Abd:  Soft, NT, ND, + BS, no organomegaly Neurological: Pt is alert. At baseline orientation, motor grossly intact Skin: Skin is warm. No rashes, other new lesions, no LE edema Psychiatric: Pt behavior is normal without agitation  All otherwise neg per pt Lab Results  Component Value Date   WBC 5.2 01/27/2020   HGB 14.6 01/27/2020   HCT 43.0 01/27/2020   PLT 194.0 01/27/2020   GLUCOSE 92 01/27/2020   CHOL  209 (H) 01/27/2020   TRIG 154.0 (H) 01/27/2020   HDL 42.80 01/27/2020   LDLCALC 135 (H) 01/27/2020   ALT 51 01/27/2020   AST 36 01/27/2020   NA 138 01/27/2020   K 4.1 01/27/2020   CL 104 01/27/2020   CREATININE 0.79 01/27/2020   BUN 12 01/27/2020   CO2 27 01/27/2020   TSH 2.10 01/27/2020   PSA 0.46 01/27/2020      Assessment & Plan:

## 2020-03-31 NOTE — Patient Instructions (Signed)
Ok to increase the BP medication to losartan HCT 100/12.5 mg per day  Please continue to monitor your BP at least every other day for the next 3 wks, as the goal is to be less than 140/90 (or better would be less than 130/80)  Please continue all other medications as before, and refills have been done if requested.  Please have the pharmacy call with any other refills you may need.  Please continue your efforts at being more active, low cholesterol diet, and weight control.  Please keep your appointments with your specialists as you may have planned  Please make an Appointment to return in 3 months (I will ask the office to call you)

## 2020-04-02 ENCOUNTER — Encounter: Payer: Self-pay | Admitting: Internal Medicine

## 2020-04-02 DIAGNOSIS — I10 Essential (primary) hypertension: Secondary | ICD-10-CM

## 2020-04-02 HISTORY — DX: Essential (primary) hypertension: I10

## 2020-04-02 NOTE — Assessment & Plan Note (Signed)
Lab Results  Component Value Date   LDLCALC 135 (H) 01/27/2020  for lower chol diet, declines statin for now

## 2020-04-02 NOTE — Assessment & Plan Note (Addendum)
Uncontrolled, to increase med to losartan hct 100/12.5 qd, f/u at home and next visit  I spent 31 minutes in preparing to see the patient by review of recent labs, imaging and procedures, obtaining and reviewing separately obtained history, communicating with the patient and family or caregiver, ordering medications, tests or procedures, and documenting clinical information in the EHR including the differential Dx, treatment, and any further evaluation and other management of htn, allergies, hld

## 2020-04-02 NOTE — Assessment & Plan Note (Signed)
Ok for [employer], also to avoid decongestants due to elevated BP

## 2020-04-03 DIAGNOSIS — R8271 Bacteriuria: Secondary | ICD-10-CM | POA: Diagnosis not present

## 2020-04-03 DIAGNOSIS — N201 Calculus of ureter: Secondary | ICD-10-CM | POA: Diagnosis not present

## 2020-04-04 ENCOUNTER — Other Ambulatory Visit: Payer: Self-pay | Admitting: Urology

## 2020-04-10 NOTE — Addendum Note (Signed)
Addended by: Wonda Horner on: 04/10/2020 11:19 AM   Modules accepted: Orders

## 2020-04-11 DIAGNOSIS — H5213 Myopia, bilateral: Secondary | ICD-10-CM | POA: Diagnosis not present

## 2020-04-17 DIAGNOSIS — L814 Other melanin hyperpigmentation: Secondary | ICD-10-CM | POA: Diagnosis not present

## 2020-04-17 DIAGNOSIS — D2271 Melanocytic nevi of right lower limb, including hip: Secondary | ICD-10-CM | POA: Diagnosis not present

## 2020-04-17 DIAGNOSIS — D2262 Melanocytic nevi of left upper limb, including shoulder: Secondary | ICD-10-CM | POA: Diagnosis not present

## 2020-04-17 DIAGNOSIS — D225 Melanocytic nevi of trunk: Secondary | ICD-10-CM | POA: Diagnosis not present

## 2020-04-17 DIAGNOSIS — D2272 Melanocytic nevi of left lower limb, including hip: Secondary | ICD-10-CM | POA: Diagnosis not present

## 2020-04-17 DIAGNOSIS — D2261 Melanocytic nevi of right upper limb, including shoulder: Secondary | ICD-10-CM | POA: Diagnosis not present

## 2020-05-16 ENCOUNTER — Encounter (HOSPITAL_BASED_OUTPATIENT_CLINIC_OR_DEPARTMENT_OTHER): Payer: Self-pay | Admitting: Urology

## 2020-05-16 ENCOUNTER — Other Ambulatory Visit: Payer: Self-pay

## 2020-05-16 DIAGNOSIS — N201 Calculus of ureter: Secondary | ICD-10-CM | POA: Diagnosis not present

## 2020-05-16 DIAGNOSIS — R8271 Bacteriuria: Secondary | ICD-10-CM | POA: Diagnosis not present

## 2020-05-16 NOTE — Progress Notes (Signed)
Spoke w/ via phone for pre-op interview---pt Lab needs dos----   I stat 8            Lab results------saw dr Zorita Pang cardiology for abrnormal ekg work up  03-29-2020 ekg done 03-29-2020 nsr, follow up prn COVID test ------05-19-2020 1055 Arrive at -------800 am 05-22-2020 NPO after MN NO Solid Food.  Clear liquids from MN until---700 am then npo Medications to take morning of surgery -----tamsulosin Diabetic medication -----n/a Patient Special Instructions -----none Pre-Op special Istructions -----none Patient verbalized understanding of instructions that were given at this phone interview. Patient denies shortness of breath, chest pain, fever, cough at this phone interview.

## 2020-05-19 ENCOUNTER — Other Ambulatory Visit (HOSPITAL_COMMUNITY): Payer: 59

## 2020-05-22 ENCOUNTER — Ambulatory Visit (HOSPITAL_BASED_OUTPATIENT_CLINIC_OR_DEPARTMENT_OTHER): Admission: RE | Admit: 2020-05-22 | Payer: 59 | Source: Home / Self Care | Admitting: Urology

## 2020-05-22 HISTORY — DX: Personal history of urinary calculi: Z87.442

## 2020-05-22 SURGERY — CYSTOURETEROSCOPY, WITH RETROGRADE PYELOGRAM AND STENT INSERTION
Anesthesia: Choice | Laterality: Left

## 2020-07-04 ENCOUNTER — Encounter: Payer: Self-pay | Admitting: Internal Medicine

## 2020-07-04 ENCOUNTER — Ambulatory Visit (INDEPENDENT_AMBULATORY_CARE_PROVIDER_SITE_OTHER): Payer: 59 | Admitting: Internal Medicine

## 2020-07-04 ENCOUNTER — Other Ambulatory Visit: Payer: Self-pay | Admitting: Internal Medicine

## 2020-07-04 ENCOUNTER — Other Ambulatory Visit: Payer: Self-pay

## 2020-07-04 VITALS — BP 128/92 | HR 88 | Temp 98.2°F | Ht 72.0 in | Wt 271.0 lb

## 2020-07-04 DIAGNOSIS — I10 Essential (primary) hypertension: Secondary | ICD-10-CM | POA: Diagnosis not present

## 2020-07-04 DIAGNOSIS — J309 Allergic rhinitis, unspecified: Secondary | ICD-10-CM | POA: Diagnosis not present

## 2020-07-04 DIAGNOSIS — E785 Hyperlipidemia, unspecified: Secondary | ICD-10-CM | POA: Diagnosis not present

## 2020-07-04 MED ORDER — LOSARTAN POTASSIUM-HCTZ 100-12.5 MG PO TABS: 1.0000 | ORAL_TABLET | Freq: Every day | ORAL | 3 refills | Status: DC

## 2020-07-04 MED FILL — LOSARTAN-HCTZ 100-12.5 MG T: 100-12.5 | 90 days supply | Qty: 90 | Fill #0

## 2020-07-04 NOTE — Assessment & Plan Note (Signed)
Improved, cont same tx,  to f/u any worsening symptoms or concerns

## 2020-07-04 NOTE — Patient Instructions (Signed)
Please continue all other medications as before, and refills have been done if requested.  Please have the pharmacy call with any other refills you may need.  Please continue your efforts at being more active, low cholesterol diet, and weight control.  Please keep your appointments with your specialists as you may have planned  Please make an Appointment to return for your 1 year visit, or sooner if needed, with Labs done a few days ahead at the [facility] lab

## 2020-07-04 NOTE — Progress Notes (Addendum)
   Subjective:    Patient ID: Charles Adams, male    DOB: 1976/05/03, 44 y.o.   MRN: [id number]  HPI  Here to f/u; overall doing ok,  Pt denies chest pain, increasing sob or doe, wheezing, orthopnea, PND, increased LE swelling, palpitations, dizziness or syncope.  Pt denies new neurological symptoms such as new headache, or facial or extremity weakness or numbness.  Pt denies polydipsia, polyuria, or low sugar episode.  Pt states overall good compliance with meds, mostly trying to follow appropriate diet, with wt overall stable,  but little exercise however.  No other new complaints.  Allergies better controlled with otc antihistamine BP Readings from Last 3 Encounters:  07/04/20 (!) 128/92  03/31/20 (!) 138/90  03/29/20 (!) 129/77   Past Medical History:  Diagnosis Date  . Allergic rhinitis, cause unspecified   . History of kidney stones   . HTN (hypertension) 04/02/2020  . Hypertension   . Numerous moles 12/15/2013   Past Surgical History:  Procedure Laterality Date  . EXTRACORPOREAL SHOCK WAVE LITHOTRIPSY Left 02/28/2020   Procedure: EXTRACORPOREAL SHOCK WAVE LITHOTRIPSY (ESWL);  Surgeon: Raynelle Bring, MD;  Location: [facility];  Service: Urology;  Laterality: Left;  . eye surgury Right 44yo   right eye metal fragment removal  . WISDOM TOOTH EXTRACTION      reports that he has never smoked. He has never used smokeless tobacco. He reports that he does not drink alcohol and does not use drugs. family history includes Arthritis in an other family member; Cancer in his father and another family member; Diabetes in an other family member; Heart disease in an other family member; Hypertension in his father and mother. No Known Allergies No current outpatient medications on file prior to visit.   No current facility-administered medications on file prior to visit.   Review of Systems All otherwise neg per pt    Objective:   Physical Exam BP (!) 128/92   Pulse 88    Temp 98.2 F (36.8 C) (Oral)   Ht 6' (1.829 m)   Wt 271 lb (122.9 kg)   SpO2 99%   BMI 36.75 kg/m  VS noted,  Constitutional: Pt appears in NAD HENT: Head: NCAT.  Right Ear: External ear normal.  Left Ear: External ear normal.  Eyes: . Pupils are equal, round, and reactive to light. Conjunctivae and EOM are normal Nose: without d/c or deformity Neck: Neck supple. Gross normal ROM Cardiovascular: Normal rate and regular rhythm.   Pulmonary/Chest: Effort normal and breath sounds without rales or wheezing.  Abd:  Soft, NT, ND, + BS, no organomegaly Neurological: Pt is alert. At baseline orientation, motor grossly intact Skin: Skin is warm. No rashes, other new lesions, no LE edema Psychiatric: Pt behavior is normal without agitation  All otherwise neg per pt Lab Results  Component Value Date   WBC 5.2 01/27/2020   HGB 14.6 01/27/2020   HCT 43.0 01/27/2020   PLT 194.0 01/27/2020   GLUCOSE 92 01/27/2020   CHOL 209 (H) 01/27/2020   TRIG 154.0 (H) 01/27/2020   HDL 42.80 01/27/2020   LDLCALC 135 (H) 01/27/2020   ALT 51 01/27/2020   AST 36 01/27/2020   NA 138 01/27/2020   K 4.1 01/27/2020   CL 104 01/27/2020   CREATININE 0.79 01/27/2020   BUN 12 01/27/2020   CO2 27 01/27/2020   TSH 2.10 01/27/2020   PSA 0.46 01/27/2020      Assessment & Plan:

## 2020-07-04 NOTE — Assessment & Plan Note (Signed)
stable overall by history and exam, recent data reviewed with pt, and pt to continue medical treatment as before,  to f/u any worsening symptoms or concerns  

## 2020-07-04 NOTE — Assessment & Plan Note (Signed)
For lower chol diet,  to f/u any worsening symptoms or concerns

## 2020-10-02 MED FILL — LOSARTAN-HCTZ 100-12.5 MG T: 100-12.5 | 90 days supply | Qty: 90 | Fill #1

## 2020-12-23 IMAGING — CT CT RENAL STONE PROTOCOL
2 of 4 series · 16 of 46 positions shown, 18 images · non-contrast
Comparison: None.

CLINICAL DATA: Hematuria, flank pain. History of prostatitis

EXAM:
CT ABDOMEN AND PELVIS WITHOUT CONTRAST
TECHNIQUE: Multidetector CT imaging of the abdomen and pelvis was performed
following the standard protocol without IV contrast.

[Series 2: axial st · axial · 0.86mm/px · z∈[+1169,+1624]mm · 13 of 103 slices shown, 15 images]
[im 6/103  soft-tissue]
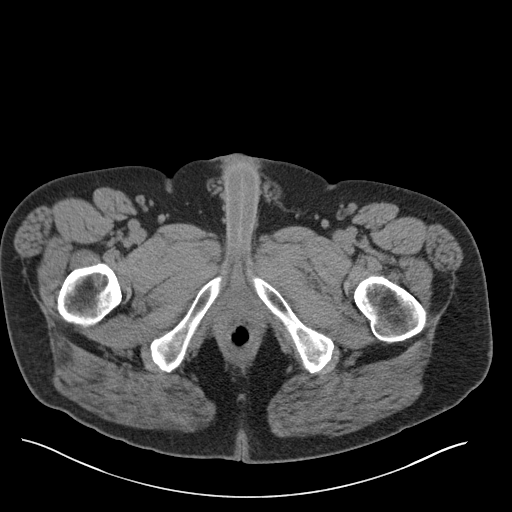
[im 6/103  bone]
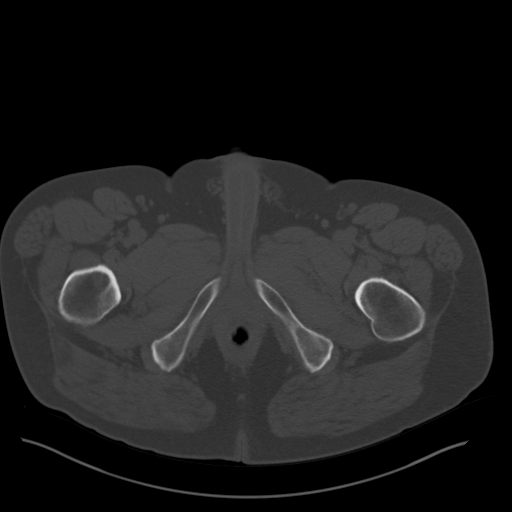
[im 16/103  soft-tissue]
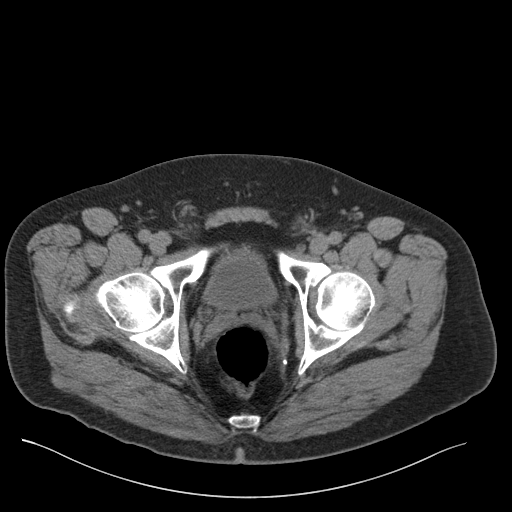
[im 21/103  soft-tissue]
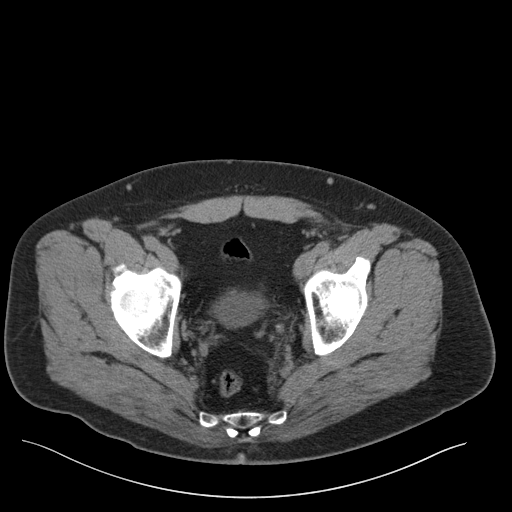
[im 31/103  soft-tissue]
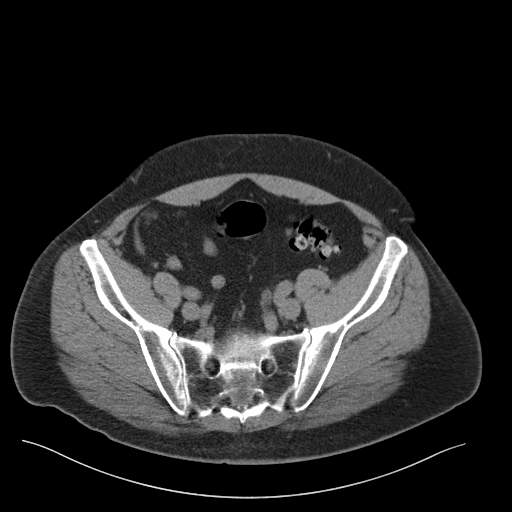
[im 36/103  soft-tissue]
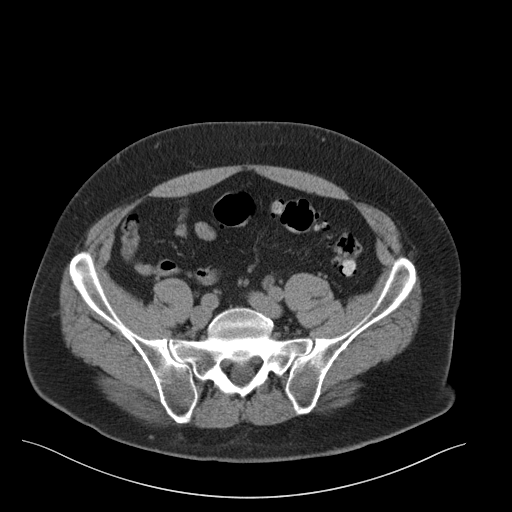
[im 46/103  soft-tissue]
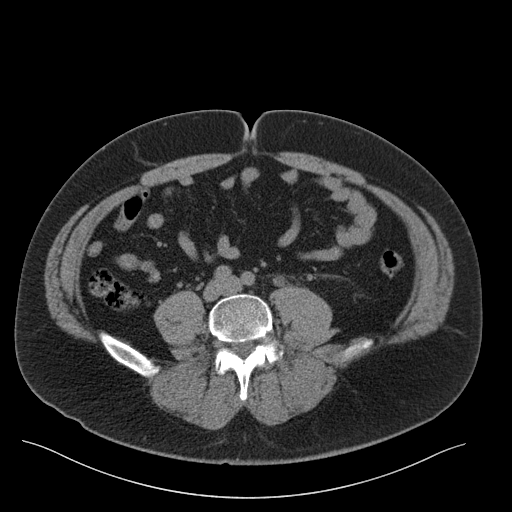
[im 52/103  soft-tissue]
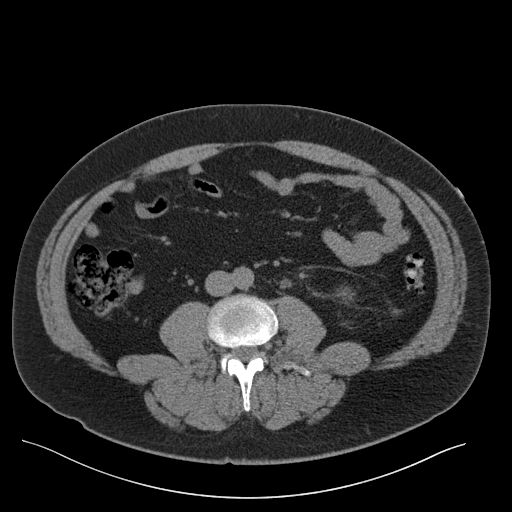
[im 57/103  soft-tissue]
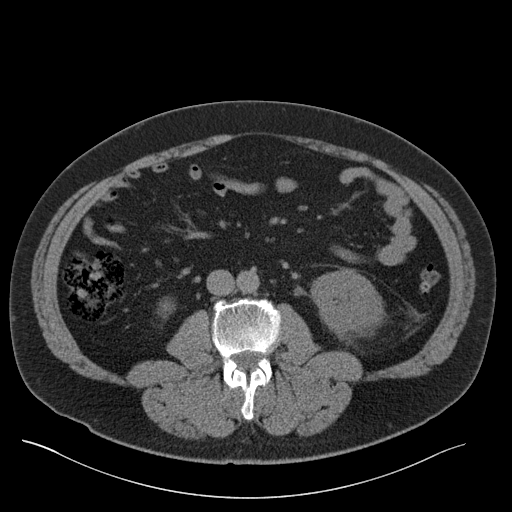
[im 67/103  soft-tissue]
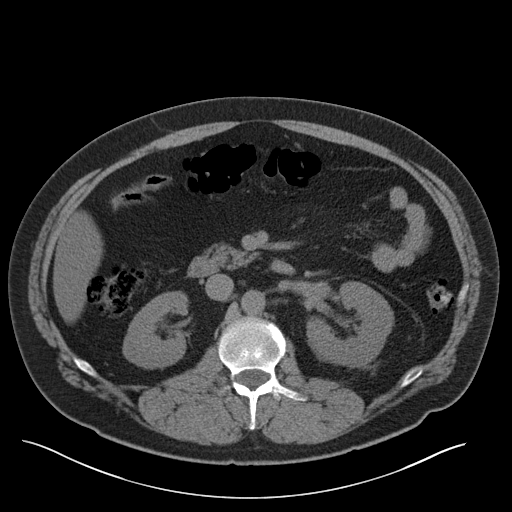
[im 67/103  bone]
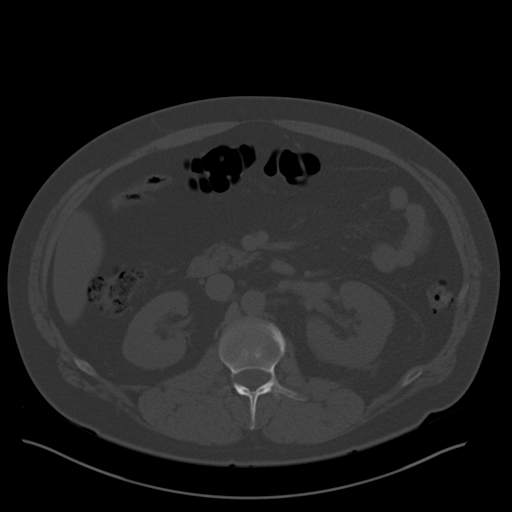
[im 72/103  soft-tissue]
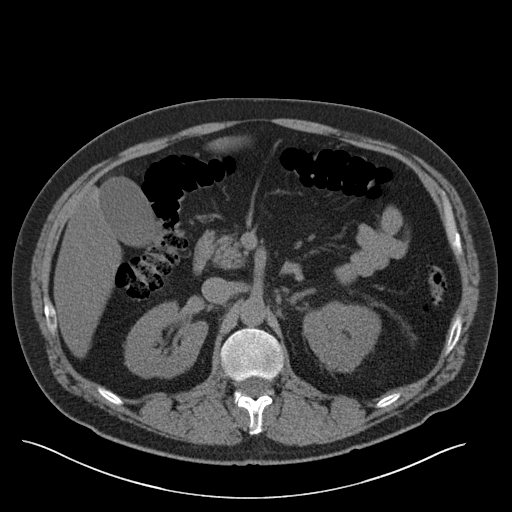
[im 82/103  soft-tissue]
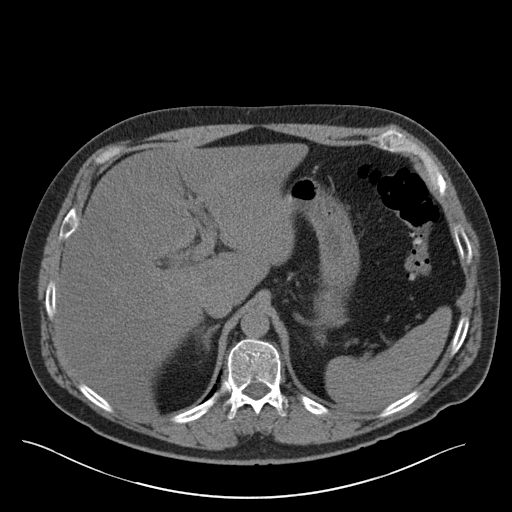
[im 87/103  soft-tissue]
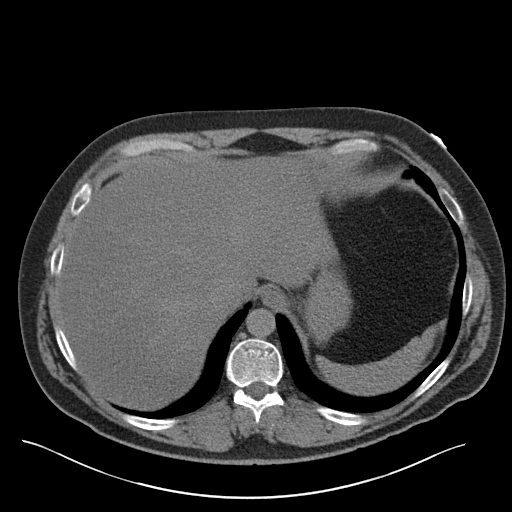
[im 97/103  soft-tissue]
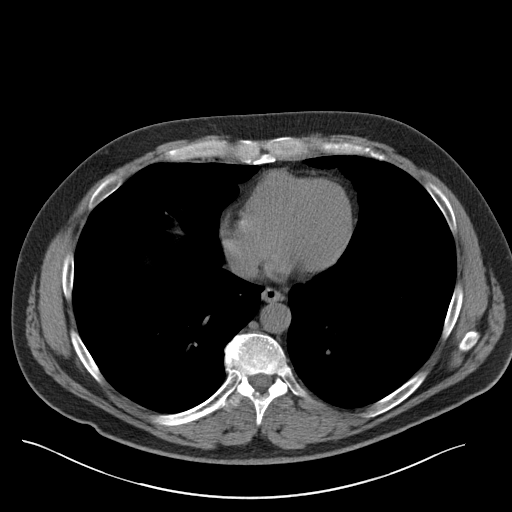

[Series 4: coronal · coronal · 0.78mm/px · 3 of 151 slices shown]
[im 51/151  soft-tissue]
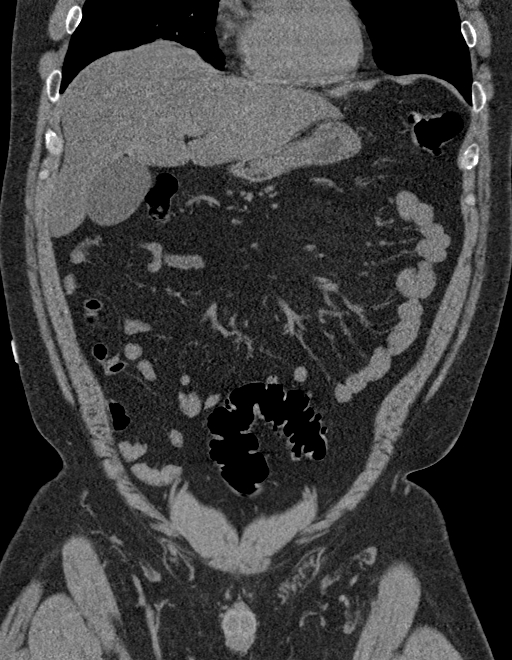
[im 67/151  soft-tissue]
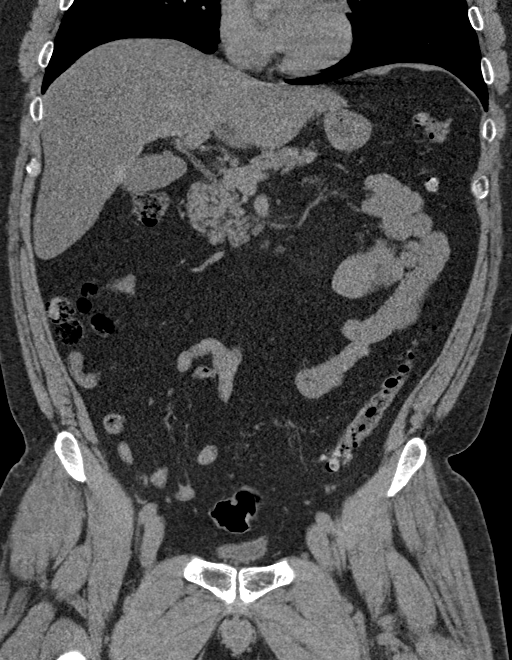
[im 84/151  soft-tissue]
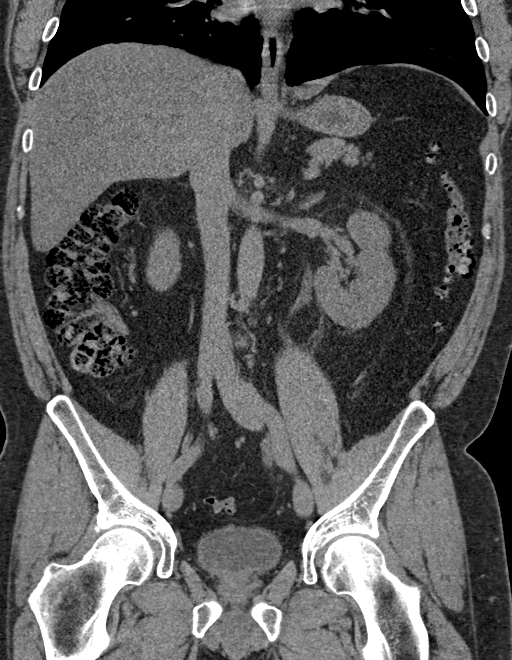

[16 of 46 positions shown; findings below may reference images not displayed]

FINDINGS: Lower chest: No acute abnormality.

Hepatobiliary: Diffusely decreased attenuation of the hepatic
parenchyma compatible with hepatic steatosis. No focal hepatic
lesion. There is a small amount of focal fatty sparing adjacent to
the gallbladder fossa. Gallbladder appears unremarkable. No
hyperdense gallstone. No biliary dilatation.

Pancreas: Unremarkable. No pancreatic ductal dilatation or
surrounding inflammatory changes.

Spleen: Normal in size without focal abnormality.

Adrenals/Urinary Tract: Unremarkable adrenal glands. Partially
obstructing 10 x 4 mm stone within the distal left ureter just
proximal to the ureterovesical junction resulting in mild left
hydroureteronephrosis. Mild left periureteral and perinephric
stranding. No right-sided renal or ureteral calculi. No focal renal
lesion. Urinary bladder is incompletely distended but appears
unremarkable.

Stomach/Bowel: Stomach is within normal limits. Appendix appears
normal. Colonic diverticulosis. No evidence of bowel wall
thickening, distention, or inflammatory changes.

Vascular/Lymphatic: No significant vascular findings are present. No
enlarged abdominal or pelvic lymph nodes.

Reproductive: Prostate is unremarkable.

Other: No abdominal wall hernia or abnormality. No abdominopelvic
ascites.

Musculoskeletal: No acute or significant osseous findings.
IMPRESSION: 1. Partially obstructing 10 x 4 mm stone within the distal left
ureter just proximal to the UVJ resulting in mild left
hydroureteronephrosis.
2. Hepatic steatosis.
3. Colonic diverticulosis.

## 2021-01-02 ENCOUNTER — Other Ambulatory Visit (HOSPITAL_COMMUNITY): Payer: Self-pay

## 2021-01-02 MED FILL — Losartan Potassium & Hydrochlorothiazide Tab 100-12.5 MG: ORAL | 90 days supply | Qty: 90 | Fill #0 | Status: AC

## 2021-01-25 ENCOUNTER — Other Ambulatory Visit (INDEPENDENT_AMBULATORY_CARE_PROVIDER_SITE_OTHER): Payer: 59

## 2021-01-25 DIAGNOSIS — E559 Vitamin D deficiency, unspecified: Secondary | ICD-10-CM | POA: Diagnosis not present

## 2021-01-25 DIAGNOSIS — Z0000 Encounter for general adult medical examination without abnormal findings: Secondary | ICD-10-CM | POA: Diagnosis not present

## 2021-01-25 DIAGNOSIS — E538 Deficiency of other specified B group vitamins: Secondary | ICD-10-CM

## 2021-01-25 LAB — CBC WITH DIFFERENTIAL/PLATELET
Basophils Absolute: 0 10*3/uL (ref 0.0–0.1)
Basophils Relative: 0.5 % (ref 0.0–3.0)
Eosinophils Absolute: 0.2 10*3/uL (ref 0.0–0.7)
Eosinophils Relative: 2.9 % (ref 0.0–5.0)
HCT: 41.1 % (ref 39.0–52.0)
Hemoglobin: 14.1 g/dL (ref 13.0–17.0)
Lymphocytes Relative: 25.6 % (ref 12.0–46.0)
Lymphs Abs: 1.4 10*3/uL (ref 0.7–4.0)
MCHC: 34.3 g/dL (ref 30.0–36.0)
MCV: 87.4 fl (ref 78.0–100.0)
Monocytes Absolute: 0.6 10*3/uL (ref 0.1–1.0)
Monocytes Relative: 10.3 % (ref 3.0–12.0)
Neutro Abs: 3.4 10*3/uL (ref 1.4–7.7)
Neutrophils Relative %: 60.7 % (ref 43.0–77.0)
Platelets: 197 10*3/uL (ref 150.0–400.0)
RBC: 4.7 Mil/uL (ref 4.22–5.81)
RDW: 13.4 % (ref 11.5–15.5)
WBC: 5.5 10*3/uL (ref 4.0–10.5)

## 2021-01-25 LAB — LIPID PANEL
Cholesterol: 185 mg/dL (ref 0–200)
HDL: 35.6 mg/dL — ABNORMAL LOW (ref >39.00)
LDL Cholesterol: 112 mg/dL — ABNORMAL HIGH (ref 0–99)
NonHDL: 149.52
Total CHOL/HDL Ratio: 5
Triglycerides: 186 mg/dL — ABNORMAL HIGH (ref 0.0–149.0)
VLDL: 37.2 mg/dL (ref 0.0–40.0)

## 2021-01-25 LAB — URINALYSIS, ROUTINE W REFLEX MICROSCOPIC
Bilirubin Urine: NEGATIVE
Hgb urine dipstick: NEGATIVE
Ketones, ur: NEGATIVE
Leukocytes,Ua: NEGATIVE
Nitrite: NEGATIVE
RBC / HPF: NONE SEEN (ref 0–?)
Specific Gravity, Urine: 1.025 (ref 1.000–1.030)
Total Protein, Urine: NEGATIVE
Urine Glucose: NEGATIVE
Urobilinogen, UA: 0.2 (ref 0.0–1.0)
pH: 6 (ref 5.0–8.0)

## 2021-01-25 LAB — VITAMIN D 25 HYDROXY (VIT D DEFICIENCY, FRACTURES): VITD: 53.51 ng/mL (ref 30.00–100.00)

## 2021-01-25 LAB — HEPATIC FUNCTION PANEL
ALT: 51 U/L (ref 0–53)
AST: 32 U/L (ref 0–37)
Albumin: 4.2 g/dL (ref 3.5–5.2)
Alkaline Phosphatase: 43 U/L (ref 39–117)
Bilirubin, Direct: 0.2 mg/dL (ref 0.0–0.3)
Total Bilirubin: 1.5 mg/dL — ABNORMAL HIGH (ref 0.2–1.2)
Total Protein: 6.4 g/dL (ref 6.0–8.3)

## 2021-01-25 LAB — BASIC METABOLIC PANEL
BUN: 16 mg/dL (ref 6–23)
CO2: 28 mEq/L (ref 19–32)
Calcium: 8.8 mg/dL (ref 8.4–10.5)
Chloride: 103 mEq/L (ref 96–112)
Creatinine, Ser: 0.85 mg/dL (ref 0.40–1.50)
GFR: 105.37 mL/min (ref >60.00)
Glucose, Bld: 98 mg/dL (ref 70–99)
Potassium: 4.1 mEq/L (ref 3.5–5.1)
Sodium: 139 mEq/L (ref 135–145)

## 2021-01-25 LAB — PSA: PSA: 0.35 ng/mL (ref 0.10–4.00)

## 2021-01-25 LAB — VITAMIN B12: Vitamin B-12: 185 pg/mL — ABNORMAL LOW (ref 211–911)

## 2021-01-25 LAB — TSH: TSH: 2.78 u[IU]/mL (ref 0.35–4.50)

## 2021-01-31 ENCOUNTER — Encounter: Payer: Self-pay | Admitting: Internal Medicine

## 2021-01-31 ENCOUNTER — Other Ambulatory Visit: Payer: Self-pay

## 2021-01-31 ENCOUNTER — Ambulatory Visit (INDEPENDENT_AMBULATORY_CARE_PROVIDER_SITE_OTHER): Payer: 59 | Admitting: Internal Medicine

## 2021-01-31 VITALS — BP 138/80 | HR 62 | Temp 97.8°F | Ht 72.0 in | Wt 265.0 lb

## 2021-01-31 DIAGNOSIS — E785 Hyperlipidemia, unspecified: Secondary | ICD-10-CM

## 2021-01-31 DIAGNOSIS — E559 Vitamin D deficiency, unspecified: Secondary | ICD-10-CM | POA: Diagnosis not present

## 2021-01-31 DIAGNOSIS — Z0001 Encounter for general adult medical examination with abnormal findings: Secondary | ICD-10-CM

## 2021-01-31 DIAGNOSIS — I10 Essential (primary) hypertension: Secondary | ICD-10-CM | POA: Diagnosis not present

## 2021-01-31 DIAGNOSIS — Z1159 Encounter for screening for other viral diseases: Secondary | ICD-10-CM

## 2021-01-31 DIAGNOSIS — Z1211 Encounter for screening for malignant neoplasm of colon: Secondary | ICD-10-CM

## 2021-01-31 DIAGNOSIS — E538 Deficiency of other specified B group vitamins: Secondary | ICD-10-CM | POA: Diagnosis not present

## 2021-01-31 NOTE — Progress Notes (Signed)
Patient ID: Charles Adams, male   DOB: [birth year], 45 y.o.   MRN: [id number]         Chief Complaint:: wellness exam and low b12, low vit D       HPI:  Charles Adams is a 45 y.o. male here for wellness exam; due  Colonoscopy, hep c screen, and declines second covid booster for now; o/w up to date with preventive referrals and immunizations                        Also not taking b12, but is taking 2000 u Vit d.  Pt denies chest pain, increased sob or doe, wheezing, orthopnea, PND, increased LE swelling, palpitations, dizziness or syncope.   Pt denies polydipsia, polyuria, or new focal neuro s/s.   Pt denies fever, wt loss, night sweats, loss of appetite, or other constitutional symptoms  No other new complaints  Wt Readings from Last 3 Encounters:  01/31/21 265 lb (120.2 kg)  07/04/20 271 lb (122.9 kg)  03/31/20 (!) 264 lb (119.7 kg)   BP Readings from Last 3 Encounters:  01/31/21 138/80  07/04/20 (!) 128/92  03/31/20 (!) 138/90   Immunization History  Administered Date(s) Administered  . Influenza-Unspecified 06/16/2018, 05/30/2020  . PFIZER(Purple Top)SARS-COV-2 Vaccination 08/16/2019, 09/03/2019, 08/10/2020  . Tdap 12/20/2014   Health Maintenance Due  Topic Date Due  . Pneumococcal Vaccine 55-56 Years old (1 of 4 - PCV13) Never done  . Hepatitis C Screening  Never done      Past Medical History:  Diagnosis Date  . Allergic rhinitis, cause unspecified   . History of kidney stones   . HTN (hypertension) 04/02/2020  . Hypertension   . Numerous moles 12/15/2013   Past Surgical History:  Procedure Laterality Date  . EXTRACORPOREAL SHOCK WAVE LITHOTRIPSY Left 02/28/2020   Procedure: EXTRACORPOREAL SHOCK WAVE LITHOTRIPSY (ESWL);  Surgeon: Raynelle Bring, MD;  Location: [facility];  Service: Urology;  Laterality: Left;  . eye surgury Right 45yo   right eye metal fragment removal  . WISDOM TOOTH EXTRACTION      reports that he has never smoked. He has never used  smokeless tobacco. He reports that he does not drink alcohol and does not use drugs. family history includes Arthritis in an other family member; Cancer in his father and another family member; Diabetes in an other family member; Heart disease in an other family member; Hypertension in his father and mother. No Known Allergies Current Outpatient Medications on File Prior to Visit  Medication Sig Dispense Refill  . losartan-hydrochlorothiazide (HYZAAR) 100-12.5 MG tablet TAKE 1 TABLET BY MOUTH ONCE DAILY 90 tablet 3   No current facility-administered medications on file prior to visit.        ROS:  All others reviewed and negative.  Objective        PE:  BP 138/80 (BP Location: Right Arm, Patient Position: Sitting, Cuff Size: Large)   Pulse 62   Temp 97.8 F (36.6 C) (Oral)   Ht 6' (1.829 m)   Wt 265 lb (120.2 kg)   SpO2 100%   BMI 35.94 kg/m                 Constitutional: Pt appears in NAD               HENT: Head: NCAT.                Right Ear: External ear  normal.                 Left Ear: External ear normal.                Eyes: . Pupils are equal, round, and reactive to light. Conjunctivae and EOM are normal               Nose: without d/c or deformity               Neck: Neck supple. Gross normal ROM               Cardiovascular: Normal rate and regular rhythm.                 Pulmonary/Chest: Effort normal and breath sounds without rales or wheezing.                Abd:  Soft, NT, ND, + BS, no organomegaly               Neurological: Pt is alert. At baseline orientation, motor grossly intact               Skin: Skin is warm. No rashes, no other new lesions, LE edema - none               Psychiatric: Pt behavior is normal without agitation   Micro: none  Cardiac tracings I have personally interpreted today:  none  Pertinent Radiological findings (summarize): none   Lab Results  Component Value Date   WBC 5.5 01/25/2021   HGB 14.1 01/25/2021   HCT 41.1 01/25/2021    PLT 197.0 01/25/2021   GLUCOSE 98 01/25/2021   CHOL 185 01/25/2021   TRIG 186.0 (H) 01/25/2021   HDL 35.60 (L) 01/25/2021   LDLCALC 112 (H) 01/25/2021   ALT 51 01/25/2021   AST 32 01/25/2021   NA 139 01/25/2021   K 4.1 01/25/2021   CL 103 01/25/2021   CREATININE 0.85 01/25/2021   BUN 16 01/25/2021   CO2 28 01/25/2021   TSH 2.78 01/25/2021   PSA 0.35 01/25/2021   Assessment/Plan:  Charles Adams is a 45 y.o. White or Caucasian [1] male with  has a past medical history of Allergic rhinitis, cause unspecified, History of kidney stones, HTN (hypertension) (04/02/2020), Hypertension, and Numerous moles (12/15/2013).  Encounter for well adult exam with abnormal findings Age and sex appropriate education and counseling updated with regular exercise and diet Referrals for preventative services - for colonoscopy, hep c screen Immunizations addressed - declines covid booster #2 Smoking counseling  - none needed Evidence for depression or other mood disorder - none significant Most recent labs reviewed. I have personally reviewed and have noted: 1) the patient's medical and social history 2) The patient's current medications and supplements 3) The patient's height, weight, and BMI have been recorded in the chart   Hyperlipidemia Lab Results  Component Value Date   [place] 112 (H) 01/25/2021   Stable, pt to continue current low chol diet, declines statin though goal is ldl < 100  HTN (hypertension) BP Readings from Last 3 Encounters:  01/31/21 138/80  07/04/20 (!) 128/92  03/31/20 (!) 138/90   Stable, pt to continue medical treatment hyzaar   Vitamin D deficiency Last vitamin D Lab Results  Component Value Date   VD25OH 53.51 01/25/2021   Stable, cont oral replacement   B12 deficiency Lab Results  Component Value Date   [name] 185 (L) 01/25/2021   Low,  to start oral replacement - b12 1000 mcg qd   Followup: Return in about 1 year (around 01/31/2022).  Cathlean Cower, MD 02/03/2021 10:53 PM [facility]

## 2021-01-31 NOTE — Patient Instructions (Addendum)
Please start the B12 1000 mcg per day for at least 6 months  Please continue all other medications as before, and refills have been done if requested.  Please have the pharmacy call with any other refills you may need.  Please continue your efforts at being more active, low cholesterol diet, and weight control.  You are otherwise up to date with prevention measures today.  Please keep your appointments with your specialists as you may have planned  You will be contacted regarding the referral for: colonoscopy  Please make an Appointment to return for your 1 year visit, or sooner if needed, with Lab testing by Appointment as well, to be done about 3-5 days before at the [facility] (so this is for TWO appointments - please see the scheduling desk as you leave)  Due to the ongoing Covid 19 pandemic, our lab now requires an appointment for any labs done at our office.  If you need labs done and do not have an appointment, please call our office ahead of time to schedule before presenting to the lab for your testing.

## 2021-02-03 ENCOUNTER — Encounter: Payer: Self-pay | Admitting: Internal Medicine

## 2021-02-03 NOTE — Assessment & Plan Note (Signed)
Last vitamin D Lab Results  Component Value Date   VD25OH 53.51 01/25/2021   Stable, cont oral replacement

## 2021-02-03 NOTE — Assessment & Plan Note (Signed)
Age and sex appropriate education and counseling updated with regular exercise and diet Referrals for preventative services - for colonoscopy, hep c screen Immunizations addressed - declines covid booster #2 Smoking counseling  - none needed Evidence for depression or other mood disorder - none significant Most recent labs reviewed. I have personally reviewed and have noted: 1) the patient's medical and social history 2) The patient's current medications and supplements 3) The patient's height, weight, and BMI have been recorded in the chart

## 2021-02-03 NOTE — Assessment & Plan Note (Signed)
BP Readings from Last 3 Encounters:  01/31/21 138/80  07/04/20 (!) 128/92  03/31/20 (!) 138/90   Stable, pt to continue medical treatment hyzaar

## 2021-02-03 NOTE — Assessment & Plan Note (Signed)
Lab Results  Component Value Date   LDLCALC 112 (H) 01/25/2021   Stable, pt to continue current low chol diet, declines statin though goal is ldl < 100

## 2021-02-03 NOTE — Assessment & Plan Note (Signed)
Lab Results  Component Value Date   [name] 185 (L) 01/25/2021   Low, to start oral replacement - b12 1000 mcg qd

## 2021-03-09 IMAGING — DX DG ABDOMEN 1V
2 series · 2 of 2 positions shown · non-contrast
Comparison: [DATE] [DATE], [DATE].  [DATE] [DATE], [DATE].

CLINICAL DATA: Preop for lithotripsy of left ureteral stone.

EXAM:
ABDOMEN - 1 VIEW

[abdomen kub (1 of 2)]
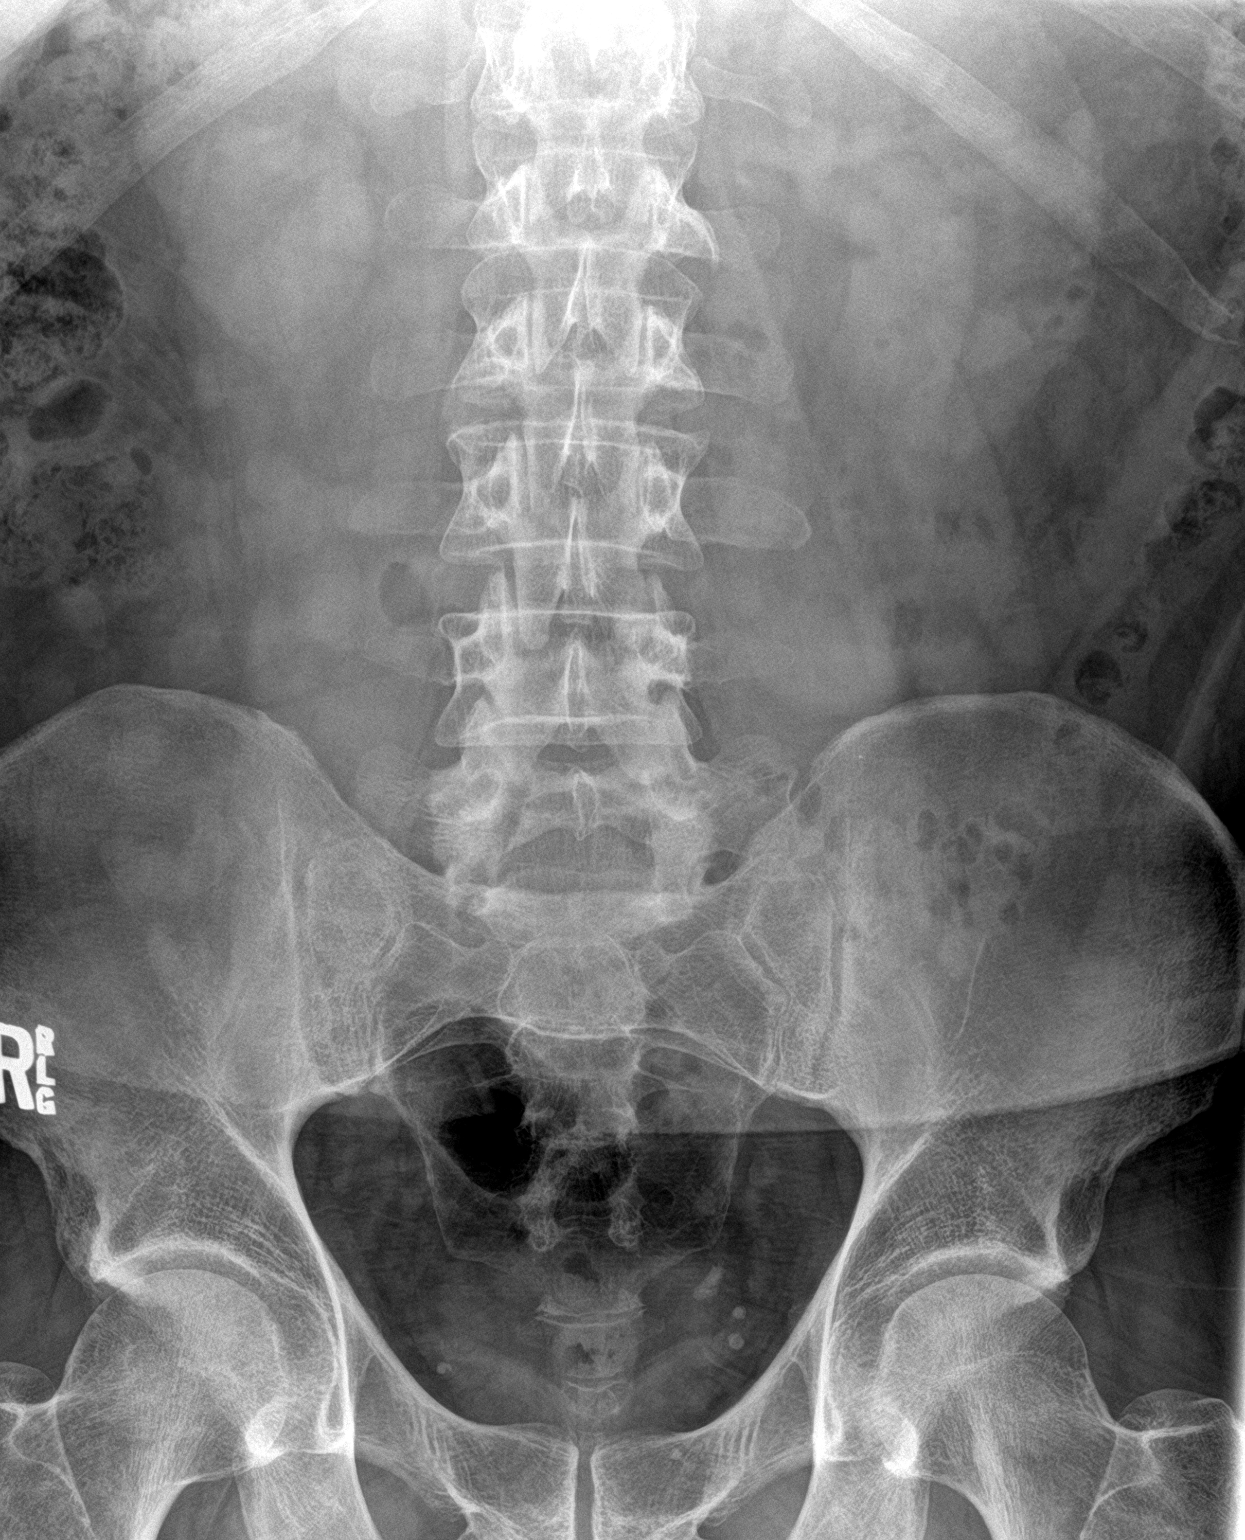

[abdomen kub (2 of 2)]
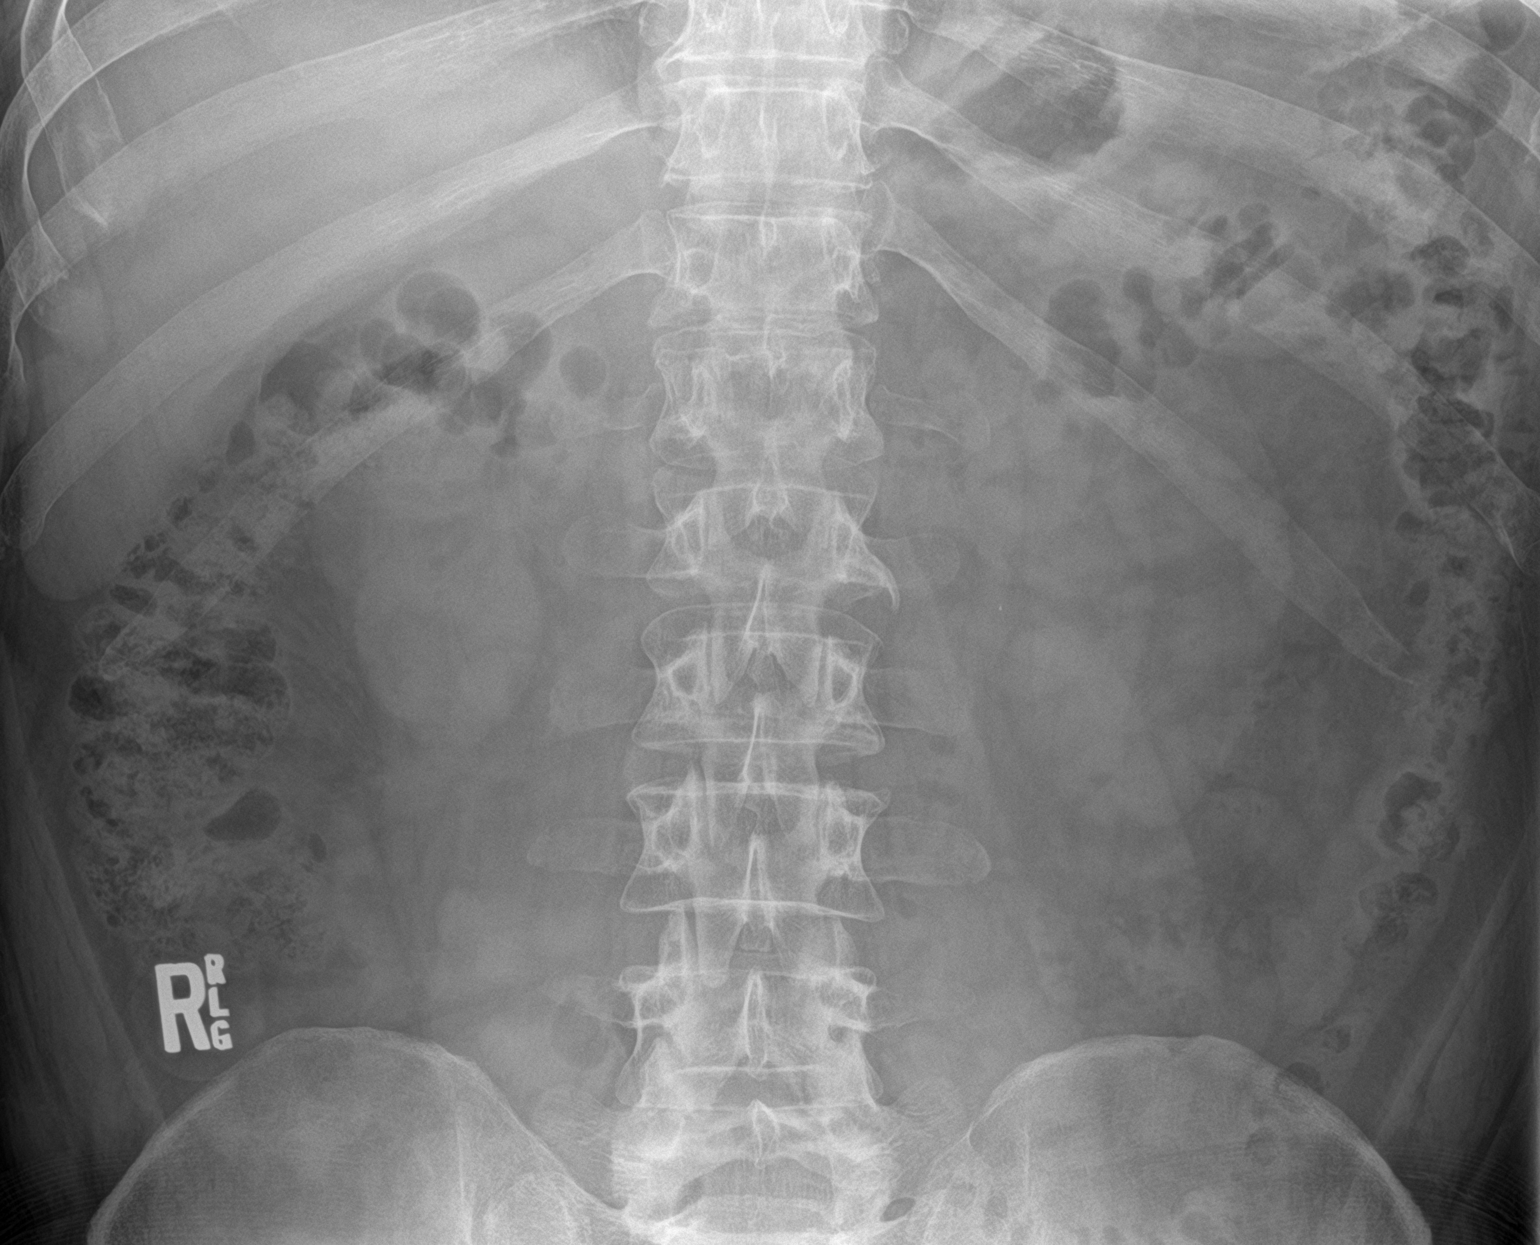

[2 of 2 positions shown; findings below may reference images not displayed]

FINDINGS: The bowel gas pattern is normal. Stable calculus is seen in the left
side of pelvis consistent with distal left ureteral calculus as
noted on prior exam. Several phleboliths are noted as well.
IMPRESSION: Negative.

## 2021-03-29 ENCOUNTER — Encounter: Payer: Self-pay | Admitting: Gastroenterology

## 2021-04-05 ENCOUNTER — Other Ambulatory Visit (HOSPITAL_COMMUNITY): Payer: Self-pay

## 2021-04-05 MED FILL — Losartan Potassium & Hydrochlorothiazide Tab 100-12.5 MG: ORAL | 90 days supply | Qty: 90 | Fill #1 | Status: AC

## 2021-04-13 DIAGNOSIS — H5213 Myopia, bilateral: Secondary | ICD-10-CM | POA: Diagnosis not present

## 2021-04-13 DIAGNOSIS — H524 Presbyopia: Secondary | ICD-10-CM | POA: Diagnosis not present

## 2021-06-01 ENCOUNTER — Other Ambulatory Visit (HOSPITAL_BASED_OUTPATIENT_CLINIC_OR_DEPARTMENT_OTHER): Payer: Self-pay

## 2021-06-01 MED ORDER — FLUARIX QUADRIVALENT 0.5 ML IM SUSY
PREFILLED_SYRINGE | INTRAMUSCULAR | 0 refills | Status: DC
Start: 2021-06-01 — End: 2021-12-04
  Filled 2021-06-01: qty 0.5, 1d supply, fill #0

## 2021-06-11 ENCOUNTER — Other Ambulatory Visit (HOSPITAL_BASED_OUTPATIENT_CLINIC_OR_DEPARTMENT_OTHER): Payer: Self-pay

## 2021-06-11 ENCOUNTER — Ambulatory Visit (AMBULATORY_SURGERY_CENTER): Payer: 59

## 2021-06-11 VITALS — Ht 72.0 in | Wt 255.0 lb

## 2021-06-11 DIAGNOSIS — Z1211 Encounter for screening for malignant neoplasm of colon: Secondary | ICD-10-CM

## 2021-06-11 MED ORDER — NA SULFATE-K SULFATE-MG SULF 17.5-3.13-1.6 GM/177ML PO SOLN
1.0000 | Freq: Once | ORAL | 0 refills | Status: AC
  Filled 2021-06-11: qty 354, 2d supply, fill #0

## 2021-06-11 NOTE — Progress Notes (Signed)
No egg or soy allergy known to patient  No issues known to pt with past sedation with any surgeries or procedures  Patient denies ever being intubated in the past.  No FH of Malignant Hyperthermia Pt is not on diet pills Pt is not on  home 02  Pt is not on blood thinners  Pt denies issues with constipation  No A fib or A flutter  Pt is fully vaccinated  for Covid   Virtual previsit  NO PA's for preps discussed with pt In PV today  Discussed with pt there will be an out-of-pocket cost for prep and that varies from $0 to 70 +  dollars - pt verbalized understanding   Due to the COVID-19 pandemic we are asking patients to follow certain guidelines in PV and the [facility]   Pt aware of COVID protocols and [facility] guidelines

## 2021-06-12 ENCOUNTER — Other Ambulatory Visit (HOSPITAL_BASED_OUTPATIENT_CLINIC_OR_DEPARTMENT_OTHER): Payer: Self-pay

## 2021-06-12 ENCOUNTER — Encounter: Payer: Self-pay | Admitting: Gastroenterology

## 2021-06-25 ENCOUNTER — Encounter: Payer: Self-pay | Admitting: Gastroenterology

## 2021-06-25 ENCOUNTER — Ambulatory Visit (AMBULATORY_SURGERY_CENTER): Payer: 59 | Admitting: Gastroenterology

## 2021-06-25 ENCOUNTER — Other Ambulatory Visit: Payer: Self-pay

## 2021-06-25 VITALS — BP 119/90 | HR 69 | Temp 98.0°F | Resp 14 | Ht 72.0 in | Wt 255.0 lb

## 2021-06-25 DIAGNOSIS — Z1211 Encounter for screening for malignant neoplasm of colon: Secondary | ICD-10-CM

## 2021-06-25 MED ORDER — SODIUM CHLORIDE 0.9 % IV SOLN: 500.0000 mL | Freq: Once | INTRAVENOUS | Status: DC

## 2021-06-25 NOTE — Progress Notes (Signed)
VS completed by JD.   Pt's states no medical or surgical changes since previsit or office visit.  

## 2021-06-25 NOTE — Progress Notes (Signed)
[facility] History and Physical   Primary Care Physician:  Biagio Borg, MD   Reason for Procedure:  Colorectal cancer screening  Plan:    Screening colonoscopy with possible interventions as needed     HPI: Charles Adams is a very pleasant 45 y.o. male here for screening colonoscopy. Denies any nausea, vomiting, abdominal pain, melena or bright red blood per rectum  The risks and benefits as well as alternatives of endoscopic procedure(s) have been discussed and reviewed. All questions answered. The patient agrees to proceed.    Past Medical History:  Diagnosis Date   Allergic rhinitis, cause unspecified    Allergy    History of kidney stones    HTN (hypertension) 04/02/2020   Hypertension    Numerous moles 12/15/2013    Past Surgical History:  Procedure Laterality Date   EXTRACORPOREAL SHOCK WAVE LITHOTRIPSY Left 02/28/2020   Procedure: EXTRACORPOREAL SHOCK WAVE LITHOTRIPSY (ESWL);  Surgeon: Raynelle Bring, MD;  Location: [facility];  Service: Urology;  Laterality: Left;   eye surgury Right 45yo   right eye metal fragment removal   WISDOM TOOTH EXTRACTION      Prior to Admission medications   Medication Sig Start Date End Date Taking? Authorizing Provider  losartan-hydrochlorothiazide (HYZAAR) 100-12.5 MG tablet TAKE 1 TABLET BY MOUTH ONCE DAILY 07/04/20 07/04/21 Yes Biagio Borg, MD  influenza vac split quadrivalent PF (FLUARIX QUADRIVALENT) 0.5 ML injection Inject into the muscle. 06/01/21       Current Outpatient Medications  Medication Sig Dispense Refill   losartan-hydrochlorothiazide (HYZAAR) 100-12.5 MG tablet TAKE 1 TABLET BY MOUTH ONCE DAILY 90 tablet 3   influenza vac split quadrivalent PF (FLUARIX QUADRIVALENT) 0.5 ML injection Inject into the muscle. 0.5 mL 0   Current Facility-Administered Medications  Medication Dose Route Frequency Provider Last Rate Last Admin   0.9 %  sodium chloride infusion  500 mL Intravenous Once  Mauri Pole, MD        Allergies as of 06/25/2021   (No Known Allergies)    Family History  Problem Relation Age of Onset   Hypertension Mother    Cancer Father        pancreatic cancer   Hypertension Father    Heart disease Other    Diabetes Other    Arthritis Other    Cancer Other        lung cancer   Colon cancer Neg Hx    Colon polyps Neg Hx    Esophageal cancer Neg Hx    Rectal cancer Neg Hx    Stomach cancer Neg Hx     Social History   Socioeconomic History   Marital status: Married    Spouse name: Not on file   Number of children: 2   Years of education: college   Highest education level: Not on file  Occupational History   Occupation: [employer]    Employer: [employer]  Tobacco Use   Smoking status: Never   Smokeless tobacco: Never  Vaping Use   Vaping Use: Never used  Substance and Sexual Activity   Alcohol use: Never   Drug use: Never   Sexual activity: Yes  Other Topics Concern   Not on file  Social History Narrative   Not on file   Social Determinants of Health   Financial Resource Strain: Not on file  Food Insecurity: Not on file  Transportation Needs: Not on file  Physical Activity: Not on file  Stress: Not on  file  Social Connections: Not on file  Intimate Partner Violence: Not on file    Review of Systems:  All other review of systems negative except as mentioned in the HPI.  Physical Exam: Vital signs in last 24 hours: BP (!) 142/100   Pulse 75   Temp 98 F (36.7 C) (Temporal)   Ht 6' (1.829 m)   Wt 255 lb (115.7 kg)   SpO2 100%   BMI 34.58 kg/m     General:   Alert, NAD Lungs:  Clear .   Heart:  Regular rate and rhythm Abdomen:  Soft, nontender and nondistended. Neuro/Psych:  Alert and cooperative. Normal mood and affect. A and O x 3  Reviewed labs, radiology imaging, old records and pertinent past GI work up  Patient is appropriate for planned procedure(s) and anesthesia in an ambulatory  setting   K. Denzil Magnuson , MD [phone]

## 2021-06-25 NOTE — Op Note (Signed)
[facility] Patient Name: Charles Adams Procedure Date: 06/25/2021 9:40 AM MRN: [id number] Endoscopist: Mauri Pole , MD Age: 45 Referring MD:  Date of Birth: August 20, 1976 Gender: Male Account #: [id number] Procedure:                Colonoscopy Indications:              Screening for colorectal malignant neoplasm Medicines:                Monitored Anesthesia Care Procedure:                Pre-Anesthesia Assessment:                           - Prior to the procedure, a History and Physical                            was performed, and patient medications and                            allergies were reviewed. The patient's tolerance of                            previous anesthesia was also reviewed. The risks                            and benefits of the procedure and the sedation                            options and risks were discussed with the patient.                            All questions were answered, and informed consent                            was obtained. Prior Anticoagulants: The patient has                            taken no previous anticoagulant or antiplatelet                            agents. ASA Grade Assessment: II - A patient with                            mild systemic disease. After reviewing the risks                            and benefits, the patient was deemed in                            satisfactory condition to undergo the procedure.                           After obtaining informed consent, the colonoscope  was passed under direct vision. Throughout the                            procedure, the patient's blood pressure, pulse, and                            oxygen saturations were monitored continuously. The                            PCF-HQ190L Colonoscope was introduced through the                            anus and advanced to the the cecum, identified by                            appendiceal  orifice and ileocecal valve. The                            colonoscopy was performed without difficulty. The                            patient tolerated the procedure well. The quality                            of the bowel preparation was excellent. The                            ileocecal valve, appendiceal orifice, and rectum                            were photographed. Scope In: 9:45:41 AM Scope Out: 10:00:25 AM Scope Withdrawal Time: 0 hours 13 minutes 23 seconds  Total Procedure Duration: 0 hours 14 minutes 44 seconds  Findings:                 The perianal and digital rectal examinations were                            normal.                           Scattered small and large-mouthed diverticula were                            found in the sigmoid colon and descending colon.                            Peri-diverticular erythema and erosion was seen in                            sigmoid colon. There was no evidence of                            diverticular bleeding.  Non-bleeding external and internal hemorrhoids were                            found during retroflexion. The hemorrhoids were                            medium-sized. Complications:            No immediate complications. Estimated Blood Loss:     Estimated blood loss was minimal. Impression:               - Moderate diverticulosis in the sigmoid colon and                            in the descending colon. Peri-diverticular erythema                            was seen. There was no evidence of diverticular                            bleeding.                           - Non-bleeding external and internal hemorrhoids.                           - No specimens collected. Recommendation:           - Resume previous diet.                           - Continue present medications.                           - Repeat colonoscopy in 10 years for screening                             purposes. Mauri Pole, MD 06/25/2021 10:08:08 AM This report has been signed electronically.

## 2021-06-25 NOTE — Patient Instructions (Signed)
Please read handouts provided. Continue present medications. Repeat colonoscopy in 10 years for screening.   YOU HAD AN ENDOSCOPIC PROCEDURE TODAY AT [facility]:   Refer to the procedure report that was given to you for any specific questions about what was found during the examination.  If the procedure report does not answer your questions, please call your gastroenterologist to clarify.  If you requested that your care partner not be given the details of your procedure findings, then the procedure report has been included in a sealed envelope for you to review at your convenience later.  YOU SHOULD EXPECT: Some feelings of bloating in the abdomen. Passage of more gas than usual.  Walking can help get rid of the air that was put into your GI tract during the procedure and reduce the bloating. If you had a lower endoscopy (such as a colonoscopy or flexible sigmoidoscopy) you may notice spotting of blood in your stool or on the toilet paper. If you underwent a bowel prep for your procedure, you may not have a normal bowel movement for a few days.  Please Note:  You might notice some irritation and congestion in your nose or some drainage.  This is from the oxygen used during your procedure.  There is no need for concern and it should clear up in a day or so.  SYMPTOMS TO REPORT IMMEDIATELY:  Following lower endoscopy (colonoscopy or flexible sigmoidoscopy):  Excessive amounts of blood in the stool  Significant tenderness or worsening of abdominal pains  Swelling of the abdomen that is new, acute  Fever of 100F or higher   For urgent or emergent issues, a gastroenterologist can be reached at any hour by calling [phone]. Do not use MyChart messaging for urgent concerns.    DIET:  We do recommend a small meal at first, but then you may proceed to your regular diet.  Drink plenty of fluids but you should avoid alcoholic beverages for 24 hours.  ACTIVITY:  You should  plan to take it easy for the rest of today and you should NOT DRIVE or use heavy machinery until tomorrow (because of the sedation medicines used during the test).    FOLLOW UP: Our staff will call the number listed on your records 48-72 hours following your procedure to check on you and address any questions or concerns that you may have regarding the information given to you following your procedure. If we do not reach you, we will leave a message.  We will attempt to reach you two times.  During this call, we will ask if you have developed any symptoms of COVID 19. If you develop any symptoms (ie: fever, flu-like symptoms, shortness of breath, cough etc.) before then, please call [phone].  If you test positive for Covid 19 in the 2 weeks post procedure, please call and report this information to [place].    If any biopsies were taken you will be contacted by phone or by letter within the next 1-3 weeks.  Please call us at [phone] if you have not heard about the biopsies in 3 weeks.    SIGNATURES/CONFIDENTIALITY: You and/or your care partner have signed paperwork which will be entered into your electronic medical record.  These signatures attest to the fact that that the information above on your After Visit Summary has been reviewed and is understood.  Full responsibility of the confidentiality of this discharge information lies with you and/or your care-partner.

## 2021-06-25 NOTE — Progress Notes (Signed)
Pt Drowsy. VSS. To PACU, report to RN. No anesthetic complications noted.  

## 2021-06-27 ENCOUNTER — Telehealth: Payer: Self-pay | Admitting: *Deleted

## 2021-06-27 NOTE — Telephone Encounter (Signed)
  Follow up Call-  Call back number 06/25/2021  Post procedure Call Back phone  # [phone]  Permission to leave phone message Yes  Some recent data might be hidden     Patient questions:  Do you have a fever, pain , or abdominal swelling? No. Pain Score  0 *  Have you tolerated food without any problems? Yes.    Have you been able to return to your normal activities? Yes.    Do you have any questions about your discharge instructions: Diet   No. Medications  No. Follow up visit  No.  Do you have questions or concerns about your Care? No.  Actions: * If pain score is 4 or above: No action needed, pain <4.  Have you developed a fever since your procedure? no  2.   Have you had an respiratory symptoms (SOB or cough) since your procedure? no  3.   Have you tested positive for COVID 19 since your procedure no  4.   Have you had any family members/close contacts diagnosed with the COVID 19 since your procedure?  no   If yes to any of these questions please route to Joylene John, RN and Joella Prince, RN

## 2021-07-04 ENCOUNTER — Other Ambulatory Visit: Payer: Self-pay | Admitting: Internal Medicine

## 2021-07-04 ENCOUNTER — Other Ambulatory Visit (HOSPITAL_BASED_OUTPATIENT_CLINIC_OR_DEPARTMENT_OTHER): Payer: Self-pay

## 2021-07-04 MED ORDER — LOSARTAN POTASSIUM-HCTZ 100-12.5 MG PO TABS
1.0000 | ORAL_TABLET | Freq: Every day | ORAL | 3 refills | Status: DC
  Filled 2021-07-04: qty 90, 90d supply, fill #0
  Filled 2021-10-05: qty 90, 90d supply, fill #1
  Filled 2022-01-04: qty 90, 90d supply, fill #2

## 2021-07-04 NOTE — Telephone Encounter (Signed)
Please refill as per office routine med refill policy (all routine meds to be refilled for 3 mo or monthly (per pt preference) up to one year from last visit, then month to month grace period for 3 mo, then further med refills will have to be denied) ? ?

## 2021-10-05 ENCOUNTER — Other Ambulatory Visit (HOSPITAL_COMMUNITY): Payer: Self-pay

## 2021-11-30 ENCOUNTER — Ambulatory Visit: Payer: 59 | Admitting: Nurse Practitioner

## 2021-11-30 VITALS — BP 122/78 | HR 90 | Temp 98.3°F | Ht 72.0 in | Wt 257.0 lb

## 2021-11-30 DIAGNOSIS — M25462 Effusion, left knee: Secondary | ICD-10-CM | POA: Diagnosis not present

## 2021-11-30 NOTE — Patient Instructions (Signed)
RICE Therapy for Routine Care of Injuries Many injuries can be cared for with rest, ice, compression, and elevation (RICE therapy). This includes: Resting the injured body part. Putting ice on the injury. Putting pressure (compression) on the injury. Raising the injured part (elevation). Using RICE therapy can help to lessen pain and swelling. Supplies needed: Ice. Plastic bag. Towel. Elastic bandage. Pillow or pillows to raise your injured body part. How to care for your injury with RICE therapy Rest Try to rest the injured part of your body. You can go back to your normal activities when your doctor says it is okay to do them and when you can do themwithout pain. If you rest the injury too much, it may not heal as well. Some injuries heal better with early movement instead of resting for too long. Ask your doctor ifyou should do exercises to help your injury get better. Ice  If told, put ice on the injured area. To do this: Put ice in a plastic bag. Place a towel between your skin and the bag. Leave the ice on for 20 minutes, 2-3 times a day. Take off the ice if your skin turns bright red. This is very important. If you cannot feel pain, heat, or cold, you have a greater risk of damage to the area. Do not put ice on your bare skin. Use ice for as many days as your doctor tells you to use it.  Compression Put pressure on the injured area. This can be done with an elastic bandage. If this type of bandage has been put on your injury: Follow instructions on the package the bandage came in about how to use it. Do not wrap the bandage too tightly. Wrap the bandage more loosely if part of your body beyond the bandage is blue, swollen, cold, painful, or loses feeling. Take off the bandage and put it on again every 3-4 hours or as told by your doctor. See your doctor if the bandage seems to make your problems worse.  Elevation Raise the injured area above the level of your heart while you  are sitting orlying down. Follow these instructions at home: If your symptoms get worse or last a long time, make a follow-up appointment with your doctor. You may need to have imaging tests, such as X-rays or an MRI. If you have imaging tests, ask how to get your results when they are ready. Return to your normal activities when your doctor says that it is safe. Keep all follow-up visits. Contact a doctor if: You keep having pain and swelling. Your symptoms get worse. Get help right away if: You have sudden, very bad pain at your injury or lower than your injury. You have redness or more swelling around your injury. You have tingling or numbness at your injury or lower than your injury, and it does not go away when you take off the bandage. Summary Many injuries can be cared for using rest, ice, compression, and elevation (RICE therapy). You can go back to your normal activities when your doctor says it is okay and when you can do them without pain. Put ice on the injured area as told by your doctor. Get help if your symptoms get worse or if you keep having pain and swelling. This information is not intended to replace advice given to you by your health care provider. Make sure you discuss any questions you have with your healthcare provider. Document Revised: 06/08/2020 Document Reviewed: 06/08/2020 Elsevier Patient Education    2022 [employer]  

## 2021-11-30 NOTE — Progress Notes (Signed)
? ? ? ?Subjective:  ?Patient ID: Charles Adams, male    DOB: 1976-08-13  Age: 46 y.o. MRN: [id number] ? ?CC:  ?Chief Complaint  ?Patient presents with  ? Swelling  ?  In left knee with no pain, starting yesterday  ?  ? ? ?HPI  ?This patient arrives today for the above. ? ?He has 1 day history of left knee swelling.  He reports that he noticed swelling when he squatted down yesterday morning while at work.  The day before he did not have any swelling to the left knee.  No obvious traumatic event like fall or MVC that triggered the swelling.  Only thing he can recall is that he ran into his home from the road after bringing his trash cans out.  The area is not painful, he reports it slightly warm to touch.  The swelling appears to be stable it does not seem to be getting better or worse.  He has not noticed any mobility issues or laxity issues.  Will sometimes be aware of the swelling when walking but otherwise it is not bothersome in any way.  He does have a history of right meniscal tear with effusion in the past. ? ?Past Medical History:  ?Diagnosis Date  ? Allergic rhinitis, cause unspecified   ? Allergy   ? History of kidney stones   ? HTN (hypertension) 04/02/2020  ? Hypertension   ? 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 ? ? ? ?Family History  ?Problem Relation Age of Onset  ? Hypertension Mother   ? Cancer Father   ?     pancreatic cancer  ? Hypertension Father   ? Heart disease Other   ? Diabetes Other   ? Arthritis Other   ? Cancer Other   ?     lung cancer  ? Colon cancer Neg Hx   ? Colon polyps Neg Hx   ? Esophageal cancer Neg Hx   ? Rectal cancer Neg Hx   ? Stomach cancer Neg Hx   ? ? ?Social History  ? ?Social History Narrative  ? Not on file  ? ?Social History  ? ?Tobacco Use  ? Smoking status: Never  ? Smokeless tobacco: Never  ?Substance Use Topics  ? Alcohol use: Never  ? ? ? ?Current Meds  ?Medication Sig  ? influenza vac split quadrivalent PF (FLUARIX QUADRIVALENT) 0.5 ML injection Inject into the muscle.   ? losartan-hydrochlorothiazide (HYZAAR) 100-12.5 MG tablet TAKE 1 TABLET BY MOUTH ONCE DAILY  ? ? ?ROS:  ?See hpi ? ? ?Objective:  ? ?Today's Vitals: BP 122/78 (BP Location: Right Arm, Patient Position: Sitting, Cuff Size: Large)   Pulse 90   Temp 98.3 ?F (36.8 ?C) (Oral)   Ht 6' (1.829 m)   Wt 257 lb (116.6 kg)   SpO2 95%   BMI 34.86 kg/m?  ? ?  11/30/2021  ?  2:11 PM 06/25/2021  ? 10:25 AM 06/25/2021  ? 10:15 AM  ?Vitals with BMI  ?Height '6\' 0"'$     ?Weight 257 lbs    ?BMI 34.85    ?Systolic 481 856 314  ?Diastolic 78 90 83  ?Pulse 90 69 76  ?  ? ?Physical Exam ?Vitals reviewed.  ?Constitutional:   ?   Appearance: Normal appearance.  ?HENT:  ?   Head: Normocephalic and atraumatic.  ?Cardiovascular:  ?   Rate and Rhythm: Normal rate and regular rhythm.  ?Pulmonary:  ?   Effort: Pulmonary effort is normal.  ?  Breath sounds: Normal breath sounds.  ?Musculoskeletal:  ?   Cervical back: Neck supple.  ?   Right knee: Normal.  ?   Left knee: Swelling, effusion, erythema and crepitus present. No deformity, ecchymosis, lacerations or bony tenderness. Normal range of motion. No tenderness. Normal alignment.  ?   Instability Tests: Anterior drawer test negative. Posterior drawer test negative.  ?Skin: ?   General: Skin is warm and dry.  ?Neurological:  ?   Mental Status: He is alert and oriented to person, place, and time.  ?Psychiatric:     ?   Mood and Affect: Mood normal.     ?   Behavior: Behavior normal.     ?   Thought Content: Thought content normal.     ?   Judgment: Judgment normal.  ? ? ? ? ? ? ? ?Assessment and Plan  ? ?1. Knee effusion, left   ? ? ? ?Plan: ?See plan via problem list below. ? ? ?Tests ordered ?No orders of the defined types were placed in this encounter. ? ? ? ? ?No orders of the defined types were placed in this encounter. ? ? ?Patient to follow-up if symptoms persist or worsen. ? ?Ailene Ards, NP ? ?

## 2021-11-30 NOTE — Assessment & Plan Note (Signed)
Acute, swelling appears to be consistent with effusion.  Other than swelling he is not having any other worrisome symptoms.  No pain, mild redness located to the area however I think this is from general inflammation, infection unlikely at this time.  There is no bony tenderness or significant heat at the area of swelling.  No lacerations/abrasions/sores noted as a source of possible infection.  I have recommended he stop at sports medicine desk downstairs to get an appointment scheduled as soon as possible for further evaluation.  I did call downstairs and they feel they can get him worked into the schedule as early as Monday.  Over the weekend he was told to utilize RICE and as needed ibuprofen for pain.  He was also educated about signs and symptoms of infection and if these were to occur to proceed to the emergency department.  He reports his understanding. ?

## 2021-12-03 ENCOUNTER — Ambulatory Visit: Payer: Self-pay

## 2021-12-03 ENCOUNTER — Ambulatory Visit (INDEPENDENT_AMBULATORY_CARE_PROVIDER_SITE_OTHER): Payer: 59 | Admitting: Family Medicine

## 2021-12-03 VITALS — BP 106/70 | HR 81 | Ht 72.0 in | Wt 258.0 lb

## 2021-12-03 DIAGNOSIS — M7042 Prepatellar bursitis, left knee: Secondary | ICD-10-CM | POA: Diagnosis not present

## 2021-12-03 DIAGNOSIS — M25562 Pain in left knee: Secondary | ICD-10-CM

## 2021-12-03 NOTE — Progress Notes (Signed)
? ? ?  Subjective:   ? ?CC: L knee pain and swelling ? ?I, Judy Pimple, am serving as a scribe for Dr. Lynne Leader. ? ?HPI: Pt is a 37 male presenting w/ c/o L knee pain and swelling since 11/29/21.  He locates the swelling to the front of knee cap.  He was seen by his PCP on 11/30/21 for these c/o.  ?Patient works doing Theatre manager for [employer].  He does some kneeling at work frequently. ? ?L knee swelling: yes ?L knee mechanical symptoms: no ?Aggravating factors: only notices it when he is kneeling.  ?Treatments tried: RICE; IBU;  ? ?Pertinent review of Systems: No fevers or chills ? ?Relevant historical information: History of a lumbar compression fracture. ? ? ?Objective:   ? ?Vitals:  ? 12/03/21 1358  ?BP: 106/70  ?Pulse: 81  ?SpO2: 97%  ? ?General: Well Developed, well nourished, and in no acute distress.  ? ?MSK: Left knee visible swelling overlying the anterior knee.  Nontender to palpation normal knee motion and strength. ?Fluid is mobile. ? ? ? ?Impression and Recommendations:   ? ?Assessment and Plan: ?46 y.o. male with left knee prepatellar bursitis.  Patient will do well with compression and padding.  Spent quite a bit of time talking about the importance of avoiding traumatizing the anterior knee in the future.  Recommend body helix compression sleeve and Voltaren gel.  Plan to recheck in 1 month.  If this is not better at that time would consider aspiration and injection.  Very likely that he will improve without needing significant intervention which I would like to avoid if possible. ? ? ?Discussed warning signs or symptoms. Please see discharge instructions. Patient expresses understanding. ? ? ?The above documentation has been reviewed and is accurate and complete Lynne Leader, M.D. ? ?

## 2021-12-03 NOTE — Patient Instructions (Signed)
Thank you for coming in today.  ? ?I recommend you obtained a compression sleeve to help with your joint problems. There are many options on the market however I recommend obtaining a Full knee Body Helix compression sleeve.  You can find information (including how to appropriate measure yourself for sizing) can be found at www.Body http://www.lambert.com/.  Many of these products are health savings account (HSA) eligible.  ? ?You can use the compression sleeve at any time throughout the day but is most important to use while being active as well as for 2 hours post-activity.   It is appropriate to ice following activity with the compression sleeve in place.  ? ?Please use Voltaren gel (Generic Diclofenac Gel) up to 4x daily for pain as needed.  This is available over-the-counter as both the name brand Voltaren gel and the generic diclofenac gel.  ? ?Recheck in 1 month unless better.  ? ?Prepatellar Bursitis ?Prepatellar bursitis is inflammation of the prepatellar bursa, which is a fluid-filled sac that cushions the kneecap (patella). Prepatellar bursitis happens when fluid builds up in this sac and causes it to swell. The condition causes knee pain. ?What are the causes? ?This condition may be caused by: ?Constant pressure on the knees from kneeling. ?A hit to the knee. ?Falling on the knee. ?Infection from bacteria. ?Moving the knee often in a forceful way. ?What increases the risk? ?You are more likely to develop this condition if: ?You play sports that have a high risk of falling on the knee or being hit on the knee. These include football, wrestling, basketball, or soccer. ?You do work in which you kneel for long periods of time, such as roofing, plumbing, or gardening. ?You have another inflammatory condition, such as gout or rheumatoid arthritis. ?What are the signs or symptoms? ?The most common symptom of this condition is knee pain that gets better with rest. Other symptoms include: ?Swelling on the front of the  kneecap. ?Warmth in the knee. ?Tenderness with activity. ?Redness in the knee. ?Inability to bend the knee or to kneel. ?How is this diagnosed? ?This condition is diagnosed based on: ?A physical exam. Your health care provider will compare your knees and check for tenderness and pain while moving your knee. ?Your medical history. ?Tests to check for infection. These may include blood tests and tests on the fluid in the bursa. ?Imaging tests, such as X-ray, MRI, or ultrasound, to check for damage in the patella, or fluid buildup and swelling in the bursa. ?How is this treated? ?This condition may be treated by: ?Resting the knee. ?Putting ice on the knee. ?Taking medicines, such as: ?NSAIDs. These can help to reduce pain and swelling. ?Antibiotics. These may be needed if you have an infection. ?Steroids. These are used to reduce swelling and inflammation, and may be prescribed if other treatments are not helping. ?Raising (elevating) the knee while resting. ?Doing exercises to help you maintain movement (physical therapy). These may be recommended after pain and swelling improve. ?Having a procedure to remove fluid from the bursa. This may be done if other treatments are not helping. ?Having surgery to remove the bursa. This may be done if you have a severe infection or if the condition keeps coming back after treatment. ?Follow these instructions at home: ?Medicines ?Take over-the-counter and prescription medicines only as told by your health care provider. ?If you were prescribed an antibiotic medicine, take it as told by your health care provider. Do not stop taking the antibiotic even if  you start to feel better. ?Managing pain, stiffness, and swelling ? ?If directed, put ice on the injured area. ?Put ice in a plastic bag. ?Place a towel between your skin and the bag. ?Leave the ice on for 20 minutes, 2-3 times a day. ?Elevate the injured area above the level of your heart while you are sitting or lying  down. ?Activity ?Do not use the injured limb to support your body weight until your health care provider says that you can. ?Rest your knee. ?Avoid activities that cause knee pain. ?Return to your normal activities as told by your health care provider. Ask your health care provider what activities are safe for you. ?Do exercises as told by your health care provider. ?General instructions ?Ask your health care provider when it is safe for you to drive. ?Do not use any products that contain nicotine or tobacco, such as cigarettes, e-cigarettes, and chewing tobacco. These can delay healing. If you need help quitting, ask your health care provider. ?Keep all follow-up visits as told by your health care provider. This is important. ?How is this prevented? ?Warm up and stretch before being active. ?Cool down and stretch after being active. ?Give your body time to rest between periods of activity. ?Maintain physical fitness, including strength and flexibility. ?Be safe and responsible while being active. This will help you to avoid falls. ?Wear knee pads if you have to kneel for a long period of time. ?Contact a health care provider if: ?Your symptoms do not improve or get worse. ?Your symptoms keep coming back after treatment. ?You develop a fever and have warmth, redness, or swelling over your knee. ?Summary ?Prepatellar bursitis is inflammation of the prepatellar bursa, which is a fluid-filled sac that cushions the kneecap (patella). ?This condition may be caused by injury or constant pressure on the knee. It may also be caused by an infection from bacteria. ?Symptoms of this condition include pain, swelling, warmth, and tenderness in the knee. ?Follow instructions from your health care provider about taking medicines, resting, and doing activities. ?Contact your health care provider if your symptoms do not improve, get worse, or keep coming back after treatment. ?This information is not intended to replace advice given  to you by your health care provider. Make sure you discuss any questions you have with your health care provider. ?Document Revised: 12/11/2018 Document Reviewed: 10/29/2018 ?Elsevier Patient Education ? [place]. ? ?

## 2021-12-04 DIAGNOSIS — M7042 Prepatellar bursitis, left knee: Secondary | ICD-10-CM | POA: Insufficient documentation

## 2022-01-04 ENCOUNTER — Other Ambulatory Visit (HOSPITAL_BASED_OUTPATIENT_CLINIC_OR_DEPARTMENT_OTHER): Payer: Self-pay

## 2022-01-07 ENCOUNTER — Ambulatory Visit (INDEPENDENT_AMBULATORY_CARE_PROVIDER_SITE_OTHER): Payer: 59 | Admitting: Family Medicine

## 2022-01-07 ENCOUNTER — Ambulatory Visit: Payer: Self-pay

## 2022-01-07 VITALS — BP 124/84 | HR 87 | Ht 72.0 in | Wt 254.6 lb

## 2022-01-07 DIAGNOSIS — M7042 Prepatellar bursitis, left knee: Secondary | ICD-10-CM | POA: Diagnosis not present

## 2022-01-07 NOTE — Progress Notes (Signed)
? ?  I, Peterson Lombard, LAT, ATC acting as a scribe for Lynne Leader, MD. ? ?Charles Adams is a 46 y.o. male who presents to [facility] today for f/u L knee pain thought to be due to prepatellar bursitis. 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 Georgina Snell on 12/03/21 and was advised to use a knee compression sleeve, Voltaren gel, and padding. Today, pt reports no real change in his L knee. Pt has been compliant w/ Dr. Clovis Riley prior recommendations. Pt notes pre-patellar swelling has continued, but is not TTP. ? ? ?Pertinent review of systems: No fevers or chills ? ?Relevant historical information: History of a compression fracture ? ? ?Exam:  ?BP 124/84   Pulse 87   Ht 6' (1.829 m)   Wt 254 lb 9.6 oz (115.5 kg)   SpO2 98%   BMI 34.53 kg/m?  ?General: Well Developed, well nourished, and in no acute distress.  ? ?MSK: Left knee: Swelling present in anterior left knee without tenderness.  No erythema present. ? ? ? ?Lab and Radiology Results ? ?Left knee prepatellar bursa aspiration and injection ?Consent obtained and timeout performed.  Discussed risks including infection and hyperglycemia. ?Skin sterilized with isopropyl alcohol and cold spray applied. ?1 mL of lidocaine injected in anterior knee superficial to prepatellar bursa ?Skin again sterilized with isopropyl alcohol. ?18-gauge needle used to access the bursa ?4 mL of clear orange-colored fluid aspirated. ?The cyst was seen to be decompressed with ultrasound following this aspiration. ?The syringe was exchanged and 40 mg of Kenalog and 1 mL of Marcaine was injected into the bursa sac ?Dressing was applied. ?Patient tolerated the procedure well. ? ?[facility] MSK Ultrasound of: Left knee prepatellar bursa ?Small prepatellar bursa sac seen prior to injection and aspiration.  No significant loculations visualized. ?Impression: Prepatellar bursitis ? ? ?Assessment and Plan: ?46 y.o. male with  left knee prepatellar bursitis.  Aspirated and injected today.  Plan for compression and recheck back as needed. ? ? ?PDMP not reviewed this encounter. ?Orders Placed This Encounter  ?Procedures  ? [place] LIMITED JOINT SPACE STRUCTURES LOW LEFT(NO LINKED CHARGES)  ?  Order Specific Question:   Reason for Exam (SYMPTOM  OR DIAGNOSIS REQUIRED)  ?  Answer:   left knee pain  ?  Order Specific Question:   Preferred imaging location?  ?  Answer:   [place]  ? ?No orders of the defined types were placed in this encounter. ? ? ? ?Discussed warning signs or symptoms. Please see discharge instructions. Patient expresses understanding. ? ? ?The above documentation has been reviewed and is accurate and complete Lynne Leader, M.D. ? ? ?

## 2022-01-07 NOTE — Patient Instructions (Addendum)
Thank you for coming in today.  ? ?You received a steroid injection today. Seek immediate medical attention if the joint becomes red, extremely painful, or is oozing fluid.  ? ?Continue compression ? ?Recheck back as needed ?

## 2022-01-25 ENCOUNTER — Other Ambulatory Visit (INDEPENDENT_AMBULATORY_CARE_PROVIDER_SITE_OTHER): Payer: 59

## 2022-01-25 DIAGNOSIS — Z1159 Encounter for screening for other viral diseases: Secondary | ICD-10-CM | POA: Diagnosis not present

## 2022-01-25 DIAGNOSIS — E538 Deficiency of other specified B group vitamins: Secondary | ICD-10-CM | POA: Diagnosis not present

## 2022-01-25 DIAGNOSIS — E785 Hyperlipidemia, unspecified: Secondary | ICD-10-CM

## 2022-01-25 DIAGNOSIS — E559 Vitamin D deficiency, unspecified: Secondary | ICD-10-CM | POA: Diagnosis not present

## 2022-01-25 DIAGNOSIS — Z0001 Encounter for general adult medical examination with abnormal findings: Secondary | ICD-10-CM | POA: Diagnosis not present

## 2022-01-25 LAB — URINALYSIS, ROUTINE W REFLEX MICROSCOPIC
Bilirubin Urine: NEGATIVE
Hgb urine dipstick: NEGATIVE
Ketones, ur: NEGATIVE
Leukocytes,Ua: NEGATIVE
Nitrite: NEGATIVE
RBC / HPF: NONE SEEN (ref 0–?)
Specific Gravity, Urine: 1.01 (ref 1.000–1.030)
Total Protein, Urine: NEGATIVE
Urine Glucose: NEGATIVE
Urobilinogen, UA: 1 (ref 0.0–1.0)
pH: 7 (ref 5.0–8.0)

## 2022-01-25 LAB — CBC WITH DIFFERENTIAL/PLATELET
Basophils Absolute: 0 10*3/uL (ref 0.0–0.1)
Basophils Relative: 0.6 % (ref 0.0–3.0)
Eosinophils Absolute: 0.1 10*3/uL (ref 0.0–0.7)
Eosinophils Relative: 2.4 % (ref 0.0–5.0)
HCT: 42.7 % (ref 39.0–52.0)
Hemoglobin: 14.2 g/dL (ref 13.0–17.0)
Lymphocytes Relative: 26.1 % (ref 12.0–46.0)
Lymphs Abs: 1.4 10*3/uL (ref 0.7–4.0)
MCHC: 33.3 g/dL (ref 30.0–36.0)
MCV: 90.2 fl (ref 78.0–100.0)
Monocytes Absolute: 0.5 10*3/uL (ref 0.1–1.0)
Monocytes Relative: 9.6 % (ref 3.0–12.0)
Neutro Abs: 3.2 10*3/uL (ref 1.4–7.7)
Neutrophils Relative %: 61.3 % (ref 43.0–77.0)
Platelets: 186 10*3/uL (ref 150.0–400.0)
RBC: 4.74 Mil/uL (ref 4.22–5.81)
RDW: 13.3 % (ref 11.5–15.5)
WBC: 5.3 10*3/uL (ref 4.0–10.5)

## 2022-01-25 LAB — HEPATIC FUNCTION PANEL
ALT: 26 U/L (ref 0–53)
AST: 23 U/L (ref 0–37)
Albumin: 4.2 g/dL (ref 3.5–5.2)
Alkaline Phosphatase: 45 U/L (ref 39–117)
Bilirubin, Direct: 0.2 mg/dL (ref 0.0–0.3)
Total Bilirubin: 1.1 mg/dL (ref 0.2–1.2)
Total Protein: 6.5 g/dL (ref 6.0–8.3)

## 2022-01-25 LAB — LIPID PANEL
Cholesterol: 208 mg/dL — ABNORMAL HIGH (ref 0–200)
HDL: 48.5 mg/dL (ref >39.00)
LDL Cholesterol: 132 mg/dL — ABNORMAL HIGH (ref 0–99)
NonHDL: 159
Total CHOL/HDL Ratio: 4
Triglycerides: 137 mg/dL (ref 0.0–149.0)
VLDL: 27.4 mg/dL (ref 0.0–40.0)

## 2022-01-25 LAB — BASIC METABOLIC PANEL
BUN: 15 mg/dL (ref 6–23)
CO2: 28 mEq/L (ref 19–32)
Calcium: 8.8 mg/dL (ref 8.4–10.5)
Chloride: 104 mEq/L (ref 96–112)
Creatinine, Ser: 0.84 mg/dL (ref 0.40–1.50)
GFR: 105 mL/min (ref >60.00)
Glucose, Bld: 94 mg/dL (ref 70–99)
Potassium: 4.9 mEq/L (ref 3.5–5.1)
Sodium: 141 mEq/L (ref 135–145)

## 2022-01-25 LAB — VITAMIN B12: Vitamin B-12: 362 pg/mL (ref 211–911)

## 2022-01-25 LAB — VITAMIN D 25 HYDROXY (VIT D DEFICIENCY, FRACTURES): VITD: 41.67 ng/mL (ref 30.00–100.00)

## 2022-01-25 LAB — TSH: TSH: 2.51 u[IU]/mL (ref 0.35–5.50)

## 2022-01-25 LAB — PSA: PSA: 0.56 ng/mL (ref 0.10–4.00)

## 2022-01-25 NOTE — Addendum Note (Signed)
Addended by: Susy Manor on: 04/02/6552 07:30 AM   Modules accepted: Orders

## 2022-01-29 LAB — HEPATITIS C ANTIBODY
Hepatitis C Ab: NONREACTIVE
SIGNAL TO CUT-OFF: 0.07 (ref <1.00)

## 2022-02-01 ENCOUNTER — Other Ambulatory Visit (HOSPITAL_BASED_OUTPATIENT_CLINIC_OR_DEPARTMENT_OTHER): Payer: Self-pay

## 2022-02-01 ENCOUNTER — Ambulatory Visit (INDEPENDENT_AMBULATORY_CARE_PROVIDER_SITE_OTHER): Payer: 59 | Admitting: Internal Medicine

## 2022-02-01 ENCOUNTER — Encounter: Payer: Self-pay | Admitting: Internal Medicine

## 2022-02-01 VITALS — BP 122/80 | HR 64 | Resp 18 | Ht 72.0 in | Wt 248.6 lb

## 2022-02-01 DIAGNOSIS — E538 Deficiency of other specified B group vitamins: Secondary | ICD-10-CM | POA: Diagnosis not present

## 2022-02-01 DIAGNOSIS — Z0001 Encounter for general adult medical examination with abnormal findings: Secondary | ICD-10-CM | POA: Diagnosis not present

## 2022-02-01 DIAGNOSIS — E7800 Pure hypercholesterolemia, unspecified: Secondary | ICD-10-CM

## 2022-02-01 DIAGNOSIS — E669 Obesity, unspecified: Secondary | ICD-10-CM

## 2022-02-01 DIAGNOSIS — E559 Vitamin D deficiency, unspecified: Secondary | ICD-10-CM

## 2022-02-01 DIAGNOSIS — I10 Essential (primary) hypertension: Secondary | ICD-10-CM | POA: Diagnosis not present

## 2022-02-01 MED ORDER — LOSARTAN POTASSIUM-HCTZ 100-12.5 MG PO TABS
1.0000 | ORAL_TABLET | Freq: Every day | ORAL | 3 refills | Status: DC
Start: 2022-02-01 — End: 2023-04-01
  Filled 2022-02-01 – 2022-04-02 (×2): qty 90, 90d supply, fill #0
  Filled 2022-07-02: qty 90, 90d supply, fill #1
  Filled 2022-10-05: qty 90, 90d supply, fill #2
  Filled 2023-01-03 (×2): qty 90, 90d supply, fill #3

## 2022-02-01 NOTE — Assessment & Plan Note (Signed)
Pt will consider wegovy, declines for now

## 2022-02-01 NOTE — Assessment & Plan Note (Signed)
Lab Results  Component Value Date   LDLCALC 132 (H) 01/25/2022   Uncontrolled, pt to continue current lower chol diet, declines statin

## 2022-02-01 NOTE — Assessment & Plan Note (Signed)

## 2022-02-01 NOTE — Assessment & Plan Note (Signed)
BP Readings from Last 3 Encounters:  02/01/22 122/80  01/07/22 124/84  12/03/21 106/70   Stable, pt to continue medical treatment for the next yr   Current Outpatient Medications (Cardiovascular):  .  losartan-hydrochlorothiazide (HYZAAR) 100-12.5 MG tablet, TAKE 1 TABLET BY MOUTH ONCE DAILY

## 2022-02-01 NOTE — Assessment & Plan Note (Signed)
Lab Results  Component Value Date   [name] 362 01/25/2022   Stable, cont oral replacement - b12 1000 mcg qd

## 2022-02-01 NOTE — Assessment & Plan Note (Signed)
Last vitamin D Lab Results  Component Value Date   VD25OH 41.67 01/25/2022   Stable, cont oral replacement

## 2022-02-01 NOTE — Patient Instructions (Signed)
Please continue all other medications as before, and refills have been done if requested.  Please have the pharmacy call with any other refills you may need.  Please continue your efforts at being more active, low cholesterol diet, and weight control.  You are otherwise up to date with prevention measures today.  Please keep your appointments with your specialists as you may have planned  Please call if you change your mind about crestor for cholesterol, or wegovy for wt loss  Please make an Appointment to return for your 1 year visit, or sooner if needed, with Lab testing by Appointment as well, to be done about 3-5 days before at the [facility] (so this is for TWO appointments - please see the scheduling desk as you leave)  Due to the ongoing Covid 19 pandemic, our lab now requires an appointment for any labs done at our office.  If you need labs done and do not have an appointment, please call our office ahead of time to schedule before presenting to the lab for your testing.

## 2022-02-01 NOTE — Progress Notes (Signed)
Patient ID: Charles Adams, male   DOB: 1975-10-08, 46 y.o.   MRN: [id number]         Chief Complaint:: wellness exam and htn, hld, obesity       HPI:  Charles Adams is a 46 y.o. male here for wellness exam; overall doing well.  Has a new position in the [employer] system and able to do better with diet, activity, wt control.  Decliens covid booster, o/w up to date                        Also Pt denies chest pain, increased sob or doe, wheezing, orthopnea, PND, increased LE swelling, palpitations, dizziness or syncope.   Pt denies polydipsia, polyuria, or new focal neuro s/s.    Pt denies fever, wt loss, night sweats, loss of appetite, or other constitutional symptoms  No other new complaints   Lost 15 lbs in the past yr with better diet and more active.  Wt Readings from Last 3 Encounters:  02/01/22 248 lb 9.6 oz (112.8 kg)  01/07/22 254 lb 9.6 oz (115.5 kg)  12/03/21 258 lb (117 kg)   BP Readings from Last 3 Encounters:  02/01/22 122/80  01/07/22 124/84  12/03/21 106/70   Immunization History  Administered Date(s) Administered   Influenza-Unspecified 06/16/2018, 05/30/2020   PFIZER(Purple Top)SARS-COV-2 Vaccination 08/16/2019, 09/03/2019, 08/10/2020   Tdap 12/20/2014   There are no preventive care reminders to display for this patient.     Past Medical History:  Diagnosis Date   Allergic rhinitis, cause unspecified    Allergy    History of kidney stones    HTN (hypertension) 04/02/2020   Hypertension    Numerous moles 12/15/2013   Past Surgical History:  Procedure Laterality Date   EXTRACORPOREAL SHOCK WAVE LITHOTRIPSY Left 02/28/2020   Procedure: EXTRACORPOREAL SHOCK WAVE LITHOTRIPSY (ESWL);  Surgeon: Raynelle Bring, MD;  [facility]: [facility];  Service: Urology;  Laterality: Left;   eye surgury Right 46yo   right eye metal fragment removal   WISDOM TOOTH EXTRACTION      reports that he has never smoked. He has never used smokeless tobacco. He reports that he  does not drink alcohol and does not use drugs. family history includes Arthritis in an other family member; Cancer in his father and another family member; Diabetes in an other family member; Heart disease in an other family member; Hypertension in his father and mother. No Known Allergies No current outpatient medications on file prior to visit.   No current facility-administered medications on file prior to visit.        ROS:  All others reviewed and negative.  Objective        PE:  BP 122/80   Pulse 64   Resp 18   Ht 6' (1.829 m)   Wt 248 lb 9.6 oz (112.8 kg)   SpO2 98%   BMI 33.72 kg/m                 Constitutional: Pt appears in NAD               HENT: Head: NCAT.                Right Ear: External ear normal.                 Left Ear: External ear normal.  Eyes: . Pupils are equal, round, and reactive to light. Conjunctivae and EOM are normal               Nose: without d/c or deformity               Neck: Neck supple. Gross normal ROM               Cardiovascular: Normal rate and regular rhythm.                 Pulmonary/Chest: Effort normal and breath sounds without rales or wheezing.                Abd:  Soft, NT, ND, + BS, no organomegaly               Neurological: Pt is alert. At baseline orientation, motor grossly intact               Skin: Skin is warm. No rashes, no other new lesions, LE edema - none               Psychiatric: Pt behavior is normal without agitation   Micro: none  Cardiac tracings I have personally interpreted today:  none  Pertinent Radiological findings (summarize): none   Lab Results  Component Value Date   WBC 5.3 01/25/2022   HGB 14.2 01/25/2022   HCT 42.7 01/25/2022   PLT 186.0 01/25/2022   GLUCOSE 94 01/25/2022   CHOL 208 (H) 01/25/2022   TRIG 137.0 01/25/2022   HDL 48.50 01/25/2022   LDLCALC 132 (H) 01/25/2022   ALT 26 01/25/2022   AST 23 01/25/2022   NA 141 01/25/2022   K 4.9 01/25/2022   CL 104 01/25/2022    CREATININE 0.84 01/25/2022   BUN 15 01/25/2022   CO2 28 01/25/2022   TSH 2.51 01/25/2022   PSA 0.56 01/25/2022   Assessment/Plan:  [name] is a 46 y.o. White or Caucasian [1] male with  has a past medical history of Allergic rhinitis, cause unspecified, Allergy, History of kidney stones, HTN (hypertension) (04/02/2020), Hypertension, and Numerous moles (12/15/2013).  Vitamin D deficiency Last vitamin D Lab Results  Component Value Date   VD25OH 41.67 01/25/2022   Stable, cont oral replacement   Encounter for well adult exam with abnormal findings Age and sex appropriate education and counseling updated with regular exercise and diet Referrals for preventative services - none needed Immunizations addressed - declines covid booster Smoking counseling  - none needed Evidence for depression or other mood disorder - none significant Most recent labs reviewed. I have personally reviewed and have noted: 1) the patient's medical and social history 2) The patient's current medications and supplements 3) The patient's height, weight, and BMI have been recorded in the chart   Obesity (BMI 30.0-34.9) Pt will consider wegovy, declines for now  Hyperlipidemia Lab Results  Component Value Date   LDLCALC 132 (H) 01/25/2022   Uncontrolled, pt to continue current lower chol diet, declines statin   HTN (hypertension) BP Readings from Last 3 Encounters:  02/01/22 122/80  01/07/22 124/84  12/03/21 106/70   Stable, pt to continue medical treatment for the next yr   Current Outpatient Medications (Cardiovascular):    losartan-hydrochlorothiazide (HYZAAR) 100-12.5 MG tablet, TAKE 1 TABLET BY MOUTH ONCE DAILY        B12 deficiency Lab Results  Component Value Date   [name] 362 01/25/2022   Stable, cont oral replacement - b12 1000 mcg qd  Followup: Return in about 1 year (around 02/02/2023).  Cathlean Cower, MD 02/01/2022 8:32 AM [facility]

## 2022-04-02 ENCOUNTER — Other Ambulatory Visit (HOSPITAL_BASED_OUTPATIENT_CLINIC_OR_DEPARTMENT_OTHER): Payer: Self-pay

## 2022-04-08 DIAGNOSIS — D225 Melanocytic nevi of trunk: Secondary | ICD-10-CM | POA: Diagnosis not present

## 2022-04-08 DIAGNOSIS — D2261 Melanocytic nevi of right upper limb, including shoulder: Secondary | ICD-10-CM | POA: Diagnosis not present

## 2022-04-08 DIAGNOSIS — D2262 Melanocytic nevi of left upper limb, including shoulder: Secondary | ICD-10-CM | POA: Diagnosis not present

## 2022-04-08 DIAGNOSIS — D485 Neoplasm of uncertain behavior of skin: Secondary | ICD-10-CM | POA: Diagnosis not present

## 2022-04-08 DIAGNOSIS — D2272 Melanocytic nevi of left lower limb, including hip: Secondary | ICD-10-CM | POA: Diagnosis not present

## 2022-04-08 DIAGNOSIS — D2271 Melanocytic nevi of right lower limb, including hip: Secondary | ICD-10-CM | POA: Diagnosis not present

## 2022-04-16 DIAGNOSIS — H5213 Myopia, bilateral: Secondary | ICD-10-CM | POA: Diagnosis not present

## 2022-04-16 DIAGNOSIS — H524 Presbyopia: Secondary | ICD-10-CM | POA: Diagnosis not present

## 2022-04-29 ENCOUNTER — Other Ambulatory Visit (HOSPITAL_BASED_OUTPATIENT_CLINIC_OR_DEPARTMENT_OTHER): Payer: Self-pay

## 2022-04-29 DIAGNOSIS — D485 Neoplasm of uncertain behavior of skin: Secondary | ICD-10-CM | POA: Diagnosis not present

## 2022-04-29 DIAGNOSIS — L988 Other specified disorders of the skin and subcutaneous tissue: Secondary | ICD-10-CM | POA: Diagnosis not present

## 2022-04-29 MED ORDER — MUPIROCIN 2 % EX OINT
TOPICAL_OINTMENT | CUTANEOUS | 0 refills | Status: DC
  Filled 2022-04-29: qty 22, 10d supply, fill #0

## 2022-06-17 ENCOUNTER — Other Ambulatory Visit (HOSPITAL_BASED_OUTPATIENT_CLINIC_OR_DEPARTMENT_OTHER): Payer: Self-pay

## 2022-06-17 MED ORDER — FLUARIX QUADRIVALENT 0.5 ML IM SUSY
PREFILLED_SYRINGE | INTRAMUSCULAR | 0 refills | Status: DC
  Filled 2022-06-17: qty 0.5, 1d supply, fill #0

## 2022-07-03 ENCOUNTER — Other Ambulatory Visit (HOSPITAL_BASED_OUTPATIENT_CLINIC_OR_DEPARTMENT_OTHER): Payer: Self-pay

## 2022-09-17 ENCOUNTER — Encounter: Payer: Self-pay | Admitting: Internal Medicine

## 2022-09-18 ENCOUNTER — Encounter: Payer: Self-pay | Admitting: Family Medicine

## 2022-09-18 ENCOUNTER — Ambulatory Visit: Payer: Commercial Managed Care - PPO | Admitting: Family Medicine

## 2022-09-18 VITALS — BP 106/80 | HR 95 | Temp 97.6°F | Ht 72.0 in | Wt 257.0 lb

## 2022-09-18 DIAGNOSIS — R42 Dizziness and giddiness: Secondary | ICD-10-CM | POA: Diagnosis not present

## 2022-09-18 DIAGNOSIS — I10 Essential (primary) hypertension: Secondary | ICD-10-CM

## 2022-09-18 NOTE — Progress Notes (Signed)
Subjective:     Patient ID: Charles Adams, male    DOB: 1975-09-06, 47 y.o.   MRN: [id number]  Chief Complaint  Patient presents with   Dizziness    Experienced yesterday when he woke up, has not had any today. BP was elevated yesterday   Blood Pressure    BP elevated 130s throughout the day but 120s settled that night but bottom number has stayed in the 90s    Dizziness   Patient is in today for waking up yesterday with dizziness described as a spinning sensation. This occurred twice yesterday morning and then resolved. He does recall having some nasal congestion and used a net pot. No longer feeling congested.   States he had never had these symptoms in the past.   Did not have any weakness, facial droop, numbness or tingling.   BP yesterday was elevated for him in the 130s/90s.   Reports taking daily losartan-HCTZ 100-12.5 mg.   Denies fever, chills, headache, chest pain, palpitations, shortness of breath, abdominal pain, N/V/D, urinary symptoms.     There are no preventive care reminders to display for this patient.   Past Medical History:  Diagnosis Date   Allergic rhinitis, cause unspecified    Allergy    History of kidney stones    HTN (hypertension) 04/02/2020   Hypertension    Numerous moles 12/15/2013    Past Surgical History:  Procedure Laterality Date   EXTRACORPOREAL SHOCK WAVE LITHOTRIPSY Left 02/28/2020   Procedure: EXTRACORPOREAL SHOCK WAVE LITHOTRIPSY (ESWL);  Surgeon: Raynelle Bring, MD;  Location: [facility];  Service: Urology;  Laterality: Left;   eye surgury Right 47yo   right eye metal fragment removal   WISDOM TOOTH EXTRACTION      Family History  Problem Relation Age of Onset   Hypertension Mother    Cancer Father        pancreatic cancer   Hypertension Father    Heart disease Other    Diabetes Other    Arthritis Other    Cancer Other        lung cancer   Colon cancer Neg Hx    Colon polyps Neg Hx     Esophageal cancer Neg Hx    Rectal cancer Neg Hx    Stomach cancer Neg Hx     Social History   Socioeconomic History   Marital status: Married    Spouse name: Not on file   Number of children: 2   Years of education: college   Highest education level: Not on file  Occupational History   Occupation: [employer]    Employer: [employer]  Tobacco Use   Smoking status: Never   Smokeless tobacco: Never  Vaping Use   Vaping Use: Never used  Substance and Sexual Activity   Alcohol use: Never   Drug use: Never   Sexual activity: Yes  Other Topics Concern   Not on file  Social History Narrative   Not on file   Social Determinants of Health   Financial Resource Strain: Not on file  Food Insecurity: Not on file  Transportation Needs: Not on file  Physical Activity: Not on file  Stress: Not on file  Social Connections: Not on file  Intimate Partner Violence: Not on file    Outpatient Medications Prior to Visit  Medication Sig Dispense Refill   losartan-hydrochlorothiazide (HYZAAR) 100-12.5 MG tablet TAKE 1 TABLET BY MOUTH ONCE DAILY 90 tablet 3   influenza vac split  quadrivalent PF (FLUARIX QUADRIVALENT) 0.5 ML injection Inject into the muscle. (Patient not taking: Reported on 09/18/2022) 0.5 mL 0   mupirocin ointment (BACTROBAN) 2 % Apply 1 a small amount to affected area once a day 22 g 0   No facility-administered medications prior to visit.    No Known Allergies  Review of Systems  Neurological:  Positive for dizziness.       Objective:    Physical Exam Constitutional:      General: He is not in acute distress.    Appearance: He is not ill-appearing.  HENT:     Right Ear: Tympanic membrane and ear canal normal.     Left Ear: Tympanic membrane and ear canal normal.     Nose: Nose normal.     Right Sinus: No maxillary sinus tenderness or frontal sinus tenderness.     Left Sinus: No maxillary sinus tenderness or frontal sinus tenderness.      Mouth/Throat:     Mouth: Mucous membranes are dry.     Pharynx: Oropharynx is clear.  Eyes:     General: Lids are normal. Vision grossly intact. No visual field deficit.    Extraocular Movements: Extraocular movements intact.     Right eye: No nystagmus.     Left eye: No nystagmus.     Conjunctiva/sclera: Conjunctivae normal.     Pupils: Pupils are equal, round, and reactive to light.  Neck:     Thyroid: No thyromegaly or thyroid tenderness.     Vascular: No JVD.     Trachea: Trachea normal.  Cardiovascular:     Rate and Rhythm: Normal rate and regular rhythm.     Heart sounds: Normal heart sounds.  Pulmonary:     Effort: Pulmonary effort is normal.     Breath sounds: Normal breath sounds.  Musculoskeletal:        General: Normal range of motion.     Cervical back: Full passive range of motion without pain and neck supple. Normal range of motion.     Right lower leg: No edema.     Left lower leg: No edema.  Lymphadenopathy:     Cervical: No cervical adenopathy.  Skin:    General: Skin is warm and dry.     Capillary Refill: Capillary refill takes less than 2 seconds.  Neurological:     General: No focal deficit present.     Mental Status: He is alert and oriented to person, place, and time.     Cranial Nerves: Cranial nerves 2-12 are intact. No facial asymmetry.     Sensory: Sensation is intact.     Motor: Motor function is intact. No weakness or pronator drift.     Coordination: Coordination is intact.     Gait: Gait is intact.  Psychiatric:        Attention and Perception: Attention normal.        Mood and Affect: Mood and affect normal.        Speech: Speech normal.        Behavior: Behavior normal.     BP 106/80 (BP Location: Left Arm, Patient Position: Sitting, Cuff Size: Large)   Pulse 95   Temp 97.6 F (36.4 C) (Temporal)   Ht 6' (1.829 m)   Wt 257 lb (116.6 kg)   SpO2 98%   BMI 34.86 kg/m  Wt Readings from Last 3 Encounters:  09/18/22 257 lb (116.6 kg)   02/01/22 248 lb 9.6 oz (112.8 kg)  01/07/22 254  lb 9.6 oz (115.5 kg)       Assessment & Plan:   Problem List Items Addressed This Visit       Cardiovascular and Mediastinum   HTN (hypertension) - Primary   Other Visit Diagnoses     Vertigo          Normal neurological exam and asymptomatic today.  Discussed that he most likely had an episode of vertigo that has since resolved. Blood pressure is in goal range.  Discussed that since he is using a new blood pressure machine at home that he may bring it in to have a nurse compare it for accuracy.  Discussed good hydration and follow-up if symptoms return or he has any new concerns.  Labs not ordered today.  No change to medication.  I have discontinued Alyson Locket. Steffensen "Jamie"'s mupirocin ointment and Fluarix Quadrivalent. I am also having him maintain his losartan-hydrochlorothiazide.  No orders of the defined types were placed in this encounter.

## 2022-09-18 NOTE — Patient Instructions (Signed)
Your blood pressure looks good today.   It sounds like you had a condition called vertigo.   Continue your current medications but increase your fluid intake.   Your urine should be a light yellow to gauge hydration.   Follow up if you have any new concerns.      Vertigo Vertigo is the feeling that you or the things around you are moving when they are not. This feeling can come and go at any time. Vertigo often goes away on its own. This condition can be dangerous if it happens when you are doing activities like driving or working with machines. Your doctor will do tests to find the cause of your vertigo. These tests will also help your doctor decide on the best treatment for you. Follow these instructions at home: Eating and drinking     Drink enough fluid to keep your pee (urine) pale yellow. Do not drink alcohol. Activity Return to your normal activities when your doctor says that it is safe. In the morning, first sit up on the side of the bed. When you feel okay, stand slowly while you hold onto something until you know that your balance is fine. Move slowly. Avoid sudden body or head movements or certain positions, as told by your doctor. Use a cane if you have trouble standing or walking. Sit down right away if you feel dizzy. Avoid doing any tasks or activities that can cause danger to you or others if you get dizzy. Avoid bending down if you feel dizzy. Place items in your home so that they are easy for you to reach without bending or leaning over. Do not drive or use machinery if you feel dizzy. General instructions Take over-the-counter and prescription medicines only as told by your doctor. Keep all follow-up visits. Contact a doctor if: Your medicine does not help your vertigo. Your problems get worse or you have new symptoms. You have a fever. You feel like you may vomit (nauseous), or this feeling gets worse. You start to vomit. Your family or friends see  changes in how you act. You lose feeling (have numbness) in part of your body. You feel prickling and tingling in a part of your body. Get help right away if: You are always dizzy. You faint. You get very bad headaches. You get a stiff neck. Bright light starts to bother you. You have trouble moving or talking. You feel weak in your hands, arms, or legs. You have changes in your hearing or in how you see (vision). These symptoms may be an emergency. Get help right away. Call your local emergency services (911 in the [place] Do not wait to see if the symptoms will go away. Do not drive yourself to the hospital. Summary Vertigo is the feeling that you or the things around you are moving when they are not. Your doctor will do tests to find the cause of your vertigo. You may be told to avoid some tasks, positions, or movements. Contact a doctor if your medicine is not helping, or if you have a fever, new symptoms, or a change in how you act. Get help right away if you get very bad headaches, or if you have changes in how you speak, hear, or see. This information is not intended to replace advice given to you by your health care provider. Make sure you discuss any questions you have with your health care provider. Document Revised: 07/19/2020 Document Reviewed: 07/19/2020 Elsevier Patient Education  2023  [employer].

## 2022-09-19 ENCOUNTER — Ambulatory Visit: Payer: Commercial Managed Care - PPO | Admitting: Internal Medicine

## 2022-10-05 ENCOUNTER — Other Ambulatory Visit (HOSPITAL_BASED_OUTPATIENT_CLINIC_OR_DEPARTMENT_OTHER): Payer: Self-pay

## 2022-10-14 ENCOUNTER — Other Ambulatory Visit (HOSPITAL_COMMUNITY): Payer: Self-pay

## 2022-11-08 ENCOUNTER — Other Ambulatory Visit (HOSPITAL_BASED_OUTPATIENT_CLINIC_OR_DEPARTMENT_OTHER): Payer: Self-pay

## 2022-11-08 MED ORDER — AMOXICILLIN 500 MG PO CAPS
500.0000 mg | ORAL_CAPSULE | Freq: Four times a day (QID) | ORAL | 0 refills | Status: DC
  Filled 2022-11-08: qty 28, 7d supply, fill #0

## 2022-11-18 DIAGNOSIS — H16423 Pannus (corneal), bilateral: Secondary | ICD-10-CM | POA: Diagnosis not present

## 2022-11-18 DIAGNOSIS — T1500XA Foreign body in cornea, unspecified eye, initial encounter: Secondary | ICD-10-CM | POA: Diagnosis not present

## 2022-11-19 ENCOUNTER — Ambulatory Visit (INDEPENDENT_AMBULATORY_CARE_PROVIDER_SITE_OTHER): Payer: Commercial Managed Care - PPO | Admitting: Family Medicine

## 2022-11-19 ENCOUNTER — Other Ambulatory Visit (HOSPITAL_BASED_OUTPATIENT_CLINIC_OR_DEPARTMENT_OTHER): Payer: Self-pay

## 2022-11-19 ENCOUNTER — Encounter: Payer: Self-pay | Admitting: Family Medicine

## 2022-11-19 VITALS — BP 110/78 | HR 98 | Temp 97.8°F | Ht 72.0 in | Wt 260.4 lb

## 2022-11-19 DIAGNOSIS — L0291 Cutaneous abscess, unspecified: Secondary | ICD-10-CM

## 2022-11-19 DIAGNOSIS — I10 Essential (primary) hypertension: Secondary | ICD-10-CM | POA: Diagnosis not present

## 2022-11-19 MED ORDER — DOXYCYCLINE HYCLATE 100 MG PO TABS
100.0000 mg | ORAL_TABLET | Freq: Two times a day (BID) | ORAL | 0 refills | Status: AC
  Filled 2022-11-19: qty 14, 7d supply, fill #0

## 2022-11-19 NOTE — Progress Notes (Signed)
Phone [phone] In person visit   Subjective:   Charles Adams is a 47 y.o. year old very pleasant male patient who presents for/with See problem oriented charting Chief Complaint  Patient presents with   spot on rib cage    Pt c/o bump up under his left under arm that he noticed yesterday.    Past Medical History-  Patient Active Problem List   Diagnosis Date Noted   Obesity (BMI 30.0-34.9) 02/01/2022   Prepatellar bursitis, left knee 12/04/2021   Knee effusion, left 11/30/2021   Vitamin D deficiency 01/31/2021   B12 deficiency 01/31/2021   HTN (hypertension) 04/02/2020   Traumatic compression fracture of lumbar vertebra ([place]) 01/21/2017   Hyperlipidemia 01/21/2017   Encounter for well adult exam with abnormal findings 12/15/2013   Numerous moles 12/15/2013   Allergic rhinitis    Acute meniscal tear, medial 01/18/2011   ANKLE INSTABILITY 01/30/2010    Medications- reviewed and updated Current Outpatient Medications  Medication Sig Dispense Refill   amoxicillin (AMOXIL) 500 MG capsule Take 1 capsule (500 mg total) by mouth every 6 (six) hours until gone. 28 capsule 0   losartan-hydrochlorothiazide (HYZAAR) 100-12.5 MG tablet TAKE 1 TABLET BY MOUTH ONCE DAILY 90 tablet 3   No current facility-administered medications for this visit.     Objective:  BP 110/78   Pulse 98   Temp 97.8 F (36.6 C)   Ht 6' (1.829 m)   Wt 260 lb 6.4 oz (118.1 kg)   SpO2 99%   BMI 35.32 kg/m  Gen: NAD, resting comfortably CV: RRR no murmurs rubs or gallops Lungs: CTAB no crackles, wheeze, rhonchi Ext: no edema Skin: warm, dry in Left axilla below hair follicles there is a 6 to 7 mm erythematous raised subcutaneous lesion-no fluctuance-no obvious purulence or head to the lesion.  Some induration under the skin with similar distribution approximately 6 to 7 mm depth.  Very mild surrounding erythema as well.  No obvious axillary lymphadenopathy    Assessment and Plan   # Bump in  axilla  S:noted a bump under his left arm- noted Sunday night 11/17/22. Area has a red ring around it. Tender with palpation. Redness perhaps slightly smaller this morning. Last night used a draw salve for bee stings etc- mild improvement today after that.  -gum graft about 2 weeks ago and placed on amoxicillin- still on it ROS-not ill appearing, no fever/chills. No new medications. Not immunocompromised. No mucus membrane involvement. No nausea or vomiting.  A/P: Small abscess vs irritated cyst in left axilla with very slight surrounding skin involvement - slightly better today per his report- we discussed if worsening he could start antibiotic doxycycline (if worsening redness or enlargement of bump) but I do not anticipate this happening. Would recommend warm compresses 3-4x a day . If fever, chills, nausea, vomiting should seek care. If area does not improve with time schedule follow up visit  #hypertension S: medication: Losartan hydrochlorothiazide 100-12.5 mg Home readings #s: doesn't regularly check.  -initial BP here 140/68 BP Readings from Last 3 Encounters:  11/19/22 110/78  09/18/22 106/80  02/01/22 122/80  A/P: Controlled. Continue current medications.  Recommended follow up: Return for as needed for new, worsening, persistent symptoms. Future Appointments  Date Time Provider [place]  02/04/2023  8:20 AM Biagio Borg, MD [facility] None    Lab/Order associations:   ICD-10-CM   1. Abscess  L02.91     2. Primary hypertension  I10  Meds ordered this encounter  Medications   doxycycline (VIBRA-TABS) 100 MG tablet    Sig: Take 1 tablet (100 mg total) by mouth 2 (two) times daily for 7 days.    Dispense:  14 tablet    Refill:  0    Return precautions advised.  Garret Reddish, MD

## 2022-11-19 NOTE — Patient Instructions (Addendum)
Small abscess vs irritated/inflamed/infected cyst in left axilla with very slight surrounding skin involvement - slightly better today per his report- we discussed if worsening he could start antibiotic doxycycline (if worsening redness or enlargement of bump) but I do not anticipate this happening. Would recommend warm compresses 3-4x a day . If fever, chills, nausea, vomiting should seek care. If area does not improve with time over next week or two schedule follow up visit  Recommended follow up: Return for as needed for new, worsening, persistent symptoms.

## 2022-11-26 ENCOUNTER — Other Ambulatory Visit (HOSPITAL_BASED_OUTPATIENT_CLINIC_OR_DEPARTMENT_OTHER): Payer: Self-pay

## 2023-01-03 ENCOUNTER — Other Ambulatory Visit (HOSPITAL_BASED_OUTPATIENT_CLINIC_OR_DEPARTMENT_OTHER): Payer: Self-pay

## 2023-01-03 ENCOUNTER — Other Ambulatory Visit: Payer: Self-pay

## 2023-02-03 ENCOUNTER — Other Ambulatory Visit (INDEPENDENT_AMBULATORY_CARE_PROVIDER_SITE_OTHER): Payer: Commercial Managed Care - PPO

## 2023-02-03 DIAGNOSIS — I10 Essential (primary) hypertension: Secondary | ICD-10-CM

## 2023-02-03 LAB — BASIC METABOLIC PANEL
BUN: 16 mg/dL (ref 6–23)
CO2: 29 mEq/L (ref 19–32)
Calcium: 8.8 mg/dL (ref 8.4–10.5)
Chloride: 103 mEq/L (ref 96–112)
Creatinine, Ser: 0.81 mg/dL (ref 0.40–1.50)
GFR: 105.4 mL/min (ref >60.00)
Glucose, Bld: 91 mg/dL (ref 70–99)
Potassium: 4.4 mEq/L (ref 3.5–5.1)
Sodium: 141 mEq/L (ref 135–145)

## 2023-02-03 LAB — LIPID PANEL
Cholesterol: 185 mg/dL (ref 0–200)
HDL: 41.2 mg/dL (ref >39.00)
LDL Cholesterol: 114 mg/dL — ABNORMAL HIGH (ref 0–99)
NonHDL: 143.88
Total CHOL/HDL Ratio: 4
Triglycerides: 151 mg/dL — ABNORMAL HIGH (ref 0.0–149.0)
VLDL: 30.2 mg/dL (ref 0.0–40.0)

## 2023-02-03 LAB — CBC WITH DIFFERENTIAL/PLATELET
Basophils Absolute: 0 10*3/uL (ref 0.0–0.1)
Basophils Relative: 0.4 % (ref 0.0–3.0)
Eosinophils Absolute: 0.1 10*3/uL (ref 0.0–0.7)
Eosinophils Relative: 2.6 % (ref 0.0–5.0)
HCT: 43 % (ref 39.0–52.0)
Hemoglobin: 14.1 g/dL (ref 13.0–17.0)
Lymphocytes Relative: 25.4 % (ref 12.0–46.0)
Lymphs Abs: 1.4 10*3/uL (ref 0.7–4.0)
MCHC: 32.7 g/dL (ref 30.0–36.0)
MCV: 90 fl (ref 78.0–100.0)
Monocytes Absolute: 0.5 10*3/uL (ref 0.1–1.0)
Monocytes Relative: 9.8 % (ref 3.0–12.0)
Neutro Abs: 3.4 10*3/uL (ref 1.4–7.7)
Neutrophils Relative %: 61.8 % (ref 43.0–77.0)
Platelets: 206 10*3/uL (ref 150.0–400.0)
RBC: 4.78 Mil/uL (ref 4.22–5.81)
RDW: 13 % (ref 11.5–15.5)
WBC: 5.5 10*3/uL (ref 4.0–10.5)

## 2023-02-03 LAB — URINALYSIS, ROUTINE W REFLEX MICROSCOPIC
Bilirubin Urine: NEGATIVE
Hgb urine dipstick: NEGATIVE
Ketones, ur: NEGATIVE
Leukocytes,Ua: NEGATIVE
Nitrite: NEGATIVE
RBC / HPF: NONE SEEN (ref 0–?)
Specific Gravity, Urine: 1.02 (ref 1.000–1.030)
Total Protein, Urine: NEGATIVE
Urine Glucose: NEGATIVE
Urobilinogen, UA: 0.2 (ref 0.0–1.0)
pH: 7 (ref 5.0–8.0)

## 2023-02-03 LAB — HEPATIC FUNCTION PANEL
ALT: 32 U/L (ref 0–53)
AST: 25 U/L (ref 0–37)
Albumin: 4.2 g/dL (ref 3.5–5.2)
Alkaline Phosphatase: 57 U/L (ref 39–117)
Bilirubin, Direct: 0.2 mg/dL (ref 0.0–0.3)
Total Bilirubin: 1.2 mg/dL (ref 0.2–1.2)
Total Protein: 6.5 g/dL (ref 6.0–8.3)

## 2023-02-03 LAB — TSH: TSH: 2.62 u[IU]/mL (ref 0.35–5.50)

## 2023-02-03 LAB — PSA: PSA: 0.61 ng/mL (ref 0.10–4.00)

## 2023-02-04 ENCOUNTER — Encounter: Payer: 59 | Admitting: Internal Medicine

## 2023-02-10 ENCOUNTER — Encounter: Payer: 59 | Admitting: Internal Medicine

## 2023-02-20 ENCOUNTER — Encounter: Payer: Self-pay | Admitting: Internal Medicine

## 2023-02-20 ENCOUNTER — Ambulatory Visit (INDEPENDENT_AMBULATORY_CARE_PROVIDER_SITE_OTHER): Payer: Commercial Managed Care - PPO | Admitting: Internal Medicine

## 2023-02-20 VITALS — BP 120/76 | HR 67 | Temp 98.9°F | Ht 72.0 in | Wt 253.0 lb

## 2023-02-20 DIAGNOSIS — Z0000 Encounter for general adult medical examination without abnormal findings: Secondary | ICD-10-CM | POA: Diagnosis not present

## 2023-02-20 DIAGNOSIS — R739 Hyperglycemia, unspecified: Secondary | ICD-10-CM

## 2023-02-20 DIAGNOSIS — Z125 Encounter for screening for malignant neoplasm of prostate: Secondary | ICD-10-CM | POA: Diagnosis not present

## 2023-02-20 DIAGNOSIS — E538 Deficiency of other specified B group vitamins: Secondary | ICD-10-CM

## 2023-02-20 DIAGNOSIS — I10 Essential (primary) hypertension: Secondary | ICD-10-CM | POA: Diagnosis not present

## 2023-02-20 DIAGNOSIS — Z0001 Encounter for general adult medical examination with abnormal findings: Secondary | ICD-10-CM

## 2023-02-20 DIAGNOSIS — E7800 Pure hypercholesterolemia, unspecified: Secondary | ICD-10-CM | POA: Diagnosis not present

## 2023-02-20 DIAGNOSIS — E559 Vitamin D deficiency, unspecified: Secondary | ICD-10-CM | POA: Diagnosis not present

## 2023-02-20 NOTE — Patient Instructions (Signed)

## 2023-02-20 NOTE — Assessment & Plan Note (Signed)
Lab Results  Component Value Date   LDLCALC 114 (H) 02/03/2023   Uncontrolled, goal ldl< 100, declines statin for now or card ct score, for lower chol diet

## 2023-02-20 NOTE — Progress Notes (Signed)
Patient ID: Charles Adams, male   DOB: March 26, 1976, 47 y.o.   MRN: [id number]         Chief Complaint:: wellness exam and hld, htn, vit d and B12 deficiency       HPI:  Charles Adams is a 47 y.o. male here for wellness exam; up to date               Also Pt denies chest pain, increased sob or doe, wheezing, orthopnea, PND, increased LE swelling, palpitations, dizziness or syncope.   Pt denies polydipsia, polyuria, or new focal neuro s/s.    Pt denies fever, wt loss, night sweats, loss of appetite, or other constitutional symptoms     Wt Readings from Last 3 Encounters:  02/20/23 253 lb (114.8 kg)  11/19/22 260 lb 6.4 oz (118.1 kg)  09/18/22 257 lb (116.6 kg)   BP Readings from Last 3 Encounters:  02/20/23 120/76  11/19/22 110/78  09/18/22 106/80   Immunization History  Administered Date(s) Administered   Influenza-Unspecified 06/16/2018, 05/30/2020, 06/04/2021, 06/14/2022   PFIZER(Purple Top)SARS-COV-2 Vaccination 08/16/2019, 09/03/2019, 08/10/2020   Tdap 12/20/2014  There are no preventive care reminders to display for this patient.    Past Medical History:  Diagnosis Date   Allergic rhinitis, cause unspecified    Allergy    History of kidney stones    HTN (hypertension) 04/02/2020   Hypertension    Numerous moles 12/15/2013   Past Surgical History:  Procedure Laterality Date   EXTRACORPOREAL SHOCK WAVE LITHOTRIPSY Left 02/28/2020   Procedure: EXTRACORPOREAL SHOCK WAVE LITHOTRIPSY (ESWL);  Surgeon: Heloise Purpura, MD;  Location: [facility];  Service: Urology;  Laterality: Left;   eye surgury Right 47yo   right eye metal fragment removal   WISDOM TOOTH EXTRACTION      reports that he has never smoked. He has never used smokeless tobacco. He reports that he does not drink alcohol and does not use drugs. family history includes Arthritis in an other family member; Cancer in his father and another family member; Diabetes in an other family member; Heart  disease in an other family member; Hypertension in his father and mother. No Known Allergies Current Outpatient Medications on File Prior to Visit  Medication Sig Dispense Refill   losartan-hydrochlorothiazide (HYZAAR) 100-12.5 MG tablet TAKE 1 TABLET BY MOUTH ONCE DAILY 90 tablet 3   No current facility-administered medications on file prior to visit.        ROS:  All others reviewed and negative.  Objective        PE:  BP 120/76 (BP Location: Right Arm, Patient Position: Sitting, Cuff Size: Normal)   Pulse 67   Temp 98.9 F (37.2 C) (Oral)   Ht 6' (1.829 m)   Wt 253 lb (114.8 kg)   SpO2 99%   BMI 34.31 kg/m                 Constitutional: Pt appears in NAD               HENT: Head: NCAT.                Right Ear: External ear normal.                 Left Ear: External ear normal.                Eyes: . Pupils are equal, round, and reactive to light. Conjunctivae and EOM are normal  Nose: without d/c or deformity               Neck: Neck supple. Gross normal ROM               Cardiovascular: Normal rate and regular rhythm.                 Pulmonary/Chest: Effort normal and breath sounds without rales or wheezing.                Abd:  Soft, NT, ND, + BS, no organomegaly               Neurological: Pt is alert. At baseline orientation, motor grossly intact               Skin: Skin is warm. No rashes, no other new lesions, LE edema - none               Psychiatric: Pt behavior is normal without agitation   Micro: none  Cardiac tracings I have personally interpreted today:  none  Pertinent Radiological findings (summarize): none   Lab Results  Component Value Date   WBC 5.5 02/03/2023   HGB 14.1 02/03/2023   HCT 43.0 02/03/2023   PLT 206.0 02/03/2023   GLUCOSE 91 02/03/2023   CHOL 185 02/03/2023   TRIG 151.0 (H) 02/03/2023   HDL 41.20 02/03/2023   LDLCALC 114 (H) 02/03/2023   ALT 32 02/03/2023   AST 25 02/03/2023   NA 141 02/03/2023   K 4.4 02/03/2023    CL 103 02/03/2023   CREATININE 0.81 02/03/2023   BUN 16 02/03/2023   CO2 29 02/03/2023   TSH 2.62 02/03/2023   PSA 0.61 02/03/2023   Assessment/Plan:  SUSIE POUSSON is a 47 y.o. White or Caucasian [1] male with  has a past medical history of Allergic rhinitis, cause unspecified, Allergy, History of kidney stones, HTN (hypertension) (04/02/2020), Hypertension, and Numerous moles (12/15/2013).  Hyperlipidemia Lab Results  Component Value Date   LDLCALC 114 (H) 02/03/2023   Uncontrolled, goal ldl< 100, declines statin for now or card ct score, for lower chol diet  Encounter for well adult exam with abnormal findings Age and sex appropriate education and counseling updated with regular exercise and diet Referrals for preventative services - none needed Immunizations addressed - none needed Smoking counseling  - none needed Evidence for depression or other mood disorder - none significant Most recent labs reviewed. I have personally reviewed and have noted: 1) the patient's medical and social history 2) The patient's current medications and supplements 3) The patient's height, weight, and BMI have been recorded in the chart   HTN (hypertension) BP Readings from Last 3 Encounters:  02/20/23 120/76  11/19/22 110/78  09/18/22 106/80   Stable, pt to continue medical treatment hyzaar 100 - 12.5 mg qd  Vitamin D deficiency Last vitamin D Lab Results  Component Value Date   VD25OH 41.67 01/25/2022   Stable, cont oral replacement   B12 deficiency Lab Results  Component Value Date   [name] 362 01/25/2022   Stable, cont oral replacement - b12 1000 mcg qd  Followup: No follow-ups on file.  Oliver Barre, MD 02/23/2023 7:28 AM [facility]

## 2023-02-23 ENCOUNTER — Encounter: Payer: Self-pay | Admitting: Internal Medicine

## 2023-02-23 NOTE — Addendum Note (Signed)
Addended by: Corwin Levins on: 02/23/2023 07:31 AM   Modules accepted: Orders

## 2023-02-23 NOTE — Assessment & Plan Note (Signed)
Lab Results  Component Value Date   [name] 362 01/25/2022   Stable, cont oral replacement - b12 1000 mcg qd 

## 2023-02-23 NOTE — Assessment & Plan Note (Signed)

## 2023-02-23 NOTE — Assessment & Plan Note (Signed)
BP Readings from Last 3 Encounters:  02/20/23 120/76  11/19/22 110/78  09/18/22 106/80   Stable, pt to continue medical treatment hyzaar 100 - 12.5 mg qd

## 2023-02-23 NOTE — Assessment & Plan Note (Signed)
Last vitamin D Lab Results  Component Value Date   VD25OH 41.67 01/25/2022   Stable, cont oral replacement  

## 2023-04-01 ENCOUNTER — Other Ambulatory Visit: Payer: Self-pay | Admitting: Internal Medicine

## 2023-04-01 ENCOUNTER — Other Ambulatory Visit (HOSPITAL_BASED_OUTPATIENT_CLINIC_OR_DEPARTMENT_OTHER): Payer: Self-pay

## 2023-04-01 ENCOUNTER — Other Ambulatory Visit: Payer: Self-pay

## 2023-04-01 MED ORDER — LOSARTAN POTASSIUM-HCTZ 100-12.5 MG PO TABS
1.0000 | ORAL_TABLET | Freq: Every day | ORAL | 3 refills | Status: DC
  Filled 2023-04-01: qty 90, 90d supply, fill #0
  Filled 2023-07-02: qty 90, 90d supply, fill #1

## 2023-05-08 DIAGNOSIS — H524 Presbyopia: Secondary | ICD-10-CM | POA: Diagnosis not present

## 2023-05-14 DIAGNOSIS — D224 Melanocytic nevi of scalp and neck: Secondary | ICD-10-CM | POA: Diagnosis not present

## 2023-05-14 DIAGNOSIS — D2262 Melanocytic nevi of left upper limb, including shoulder: Secondary | ICD-10-CM | POA: Diagnosis not present

## 2023-05-14 DIAGNOSIS — D225 Melanocytic nevi of trunk: Secondary | ICD-10-CM | POA: Diagnosis not present

## 2023-05-14 DIAGNOSIS — D2272 Melanocytic nevi of left lower limb, including hip: Secondary | ICD-10-CM | POA: Diagnosis not present

## 2023-05-14 DIAGNOSIS — D2271 Melanocytic nevi of right lower limb, including hip: Secondary | ICD-10-CM | POA: Diagnosis not present

## 2023-05-14 DIAGNOSIS — D2261 Melanocytic nevi of right upper limb, including shoulder: Secondary | ICD-10-CM | POA: Diagnosis not present

## 2023-05-14 DIAGNOSIS — L821 Other seborrheic keratosis: Secondary | ICD-10-CM | POA: Diagnosis not present

## 2023-05-14 DIAGNOSIS — L905 Scar conditions and fibrosis of skin: Secondary | ICD-10-CM | POA: Diagnosis not present

## 2023-06-20 ENCOUNTER — Other Ambulatory Visit (HOSPITAL_BASED_OUTPATIENT_CLINIC_OR_DEPARTMENT_OTHER): Payer: Self-pay

## 2023-06-20 MED ORDER — FLULAVAL 0.5 ML IM SUSY
0.5000 mL | PREFILLED_SYRINGE | Freq: Once | INTRAMUSCULAR | 0 refills | Status: AC
  Filled 2023-06-20: qty 0.5, 1d supply, fill #0

## 2023-08-11 DIAGNOSIS — H04123 Dry eye syndrome of bilateral lacrimal glands: Secondary | ICD-10-CM | POA: Diagnosis not present

## 2023-08-11 DIAGNOSIS — H16423 Pannus (corneal), bilateral: Secondary | ICD-10-CM | POA: Diagnosis not present

## 2023-08-11 DIAGNOSIS — H0288A Meibomian gland dysfunction right eye, upper and lower eyelids: Secondary | ICD-10-CM | POA: Diagnosis not present

## 2023-08-11 DIAGNOSIS — H0288B Meibomian gland dysfunction left eye, upper and lower eyelids: Secondary | ICD-10-CM | POA: Diagnosis not present

## 2023-09-01 ENCOUNTER — Encounter: Payer: Self-pay | Admitting: Family Medicine

## 2023-09-01 ENCOUNTER — Ambulatory Visit: Payer: Commercial Managed Care - PPO | Admitting: Family Medicine

## 2023-09-01 ENCOUNTER — Other Ambulatory Visit (HOSPITAL_BASED_OUTPATIENT_CLINIC_OR_DEPARTMENT_OTHER): Payer: Self-pay

## 2023-09-01 VITALS — BP 138/100 | HR 86 | Temp 97.9°F | Ht 72.0 in | Wt 254.0 lb

## 2023-09-01 DIAGNOSIS — I10 Essential (primary) hypertension: Secondary | ICD-10-CM

## 2023-09-01 MED ORDER — LOSARTAN POTASSIUM-HCTZ 100-25 MG PO TABS
1.0000 | ORAL_TABLET | Freq: Every day | ORAL | 1 refills | Status: DC
Start: 2023-09-01 — End: 2023-09-08
  Filled 2023-09-01: qty 90, 90d supply, fill #0

## 2023-09-01 NOTE — Progress Notes (Signed)
Assessment & Plan:  1. Primary hypertension (Primary) Uncontrolled.  Education provided on the DASH eating plan.  Losartan hydrochlorothiazide increased to the maximum dosage.  Advised patient to start taking in the morning.  Encouraged him to continue monitoring his blood pressure but only once daily.  He is to keep a log and bring it back with him to his next appointment with his PCP. - losartan-hydrochlorothiazide (HYZAAR) 100-25 MG tablet; Take 1 tablet by mouth daily.  Dispense: 90 tablet; Refill: 1   Follow up plan: Return in about 4 weeks (around 09/29/2023) for HTN with PCP.  [name], MSN, APRN, FNP-C  Subjective:  HPI: Charles Adams is a 47 y.o. male presenting on 09/01/2023 for Hypertension (Elevated at home - Started about 1 week ago with HA and head pressure (thought it was sinus at first, but meds never helped) )  Patient reports a week and a half ago he started experiencing pressure in his head, headache, and dizziness when he turns his head really fast.  He initially thought it may be his sinuses due to the pressure and dizziness, so he started taking Sudafed and Tylenol.  He denies any upper respiratory symptoms.  Symptoms never improved with self treatment so he decided to check his blood pressure.  He reports the following readings: 128-152 with systolic >140 on 3/7 readings; diastolic >90 on all readings.  States he does not add salt to his food.  He is exercising with elastic bands a little each morning.  Denies any swelling.    ROS: Negative unless specifically indicated above in HPI.   Relevant past medical history reviewed and updated as indicated.   Allergies and medications reviewed and updated.   Current Outpatient Medications:    losartan-hydrochlorothiazide (HYZAAR) 100-12.5 MG tablet, TAKE 1 TABLET BY MOUTH ONCE DAILY, Disp: 90 tablet, Rfl: 3   Multiple Vitamin (MULTIVITAMIN WITH MINERALS) TABS tablet, Take 1 tablet by mouth daily. Vit c, zinc and  others, Disp: , Rfl:   No Known Allergies  Objective:   BP (!) 138/100   Pulse 86   Temp 97.9 F (36.6 C)   Ht 6' (1.829 m)   Wt 254 lb (115.2 kg)   SpO2 95%   BMI 34.45 kg/m    Physical Exam Vitals reviewed.  Constitutional:      General: He is not in acute distress.    Appearance: Normal appearance. He is not ill-appearing, toxic-appearing or diaphoretic.  HENT:     Head: Normocephalic and atraumatic.  Eyes:     General: No scleral icterus.       Right eye: No discharge.        Left eye: No discharge.     Conjunctiva/sclera: Conjunctivae normal.  Cardiovascular:     Rate and Rhythm: Normal rate and regular rhythm.     Heart sounds: Normal heart sounds. No murmur heard.    No friction rub. No gallop.  Pulmonary:     Effort: Pulmonary effort is normal. No respiratory distress.     Breath sounds: Normal breath sounds. No stridor. No wheezing, rhonchi or rales.  Musculoskeletal:        General: Normal range of motion.     Cervical back: Normal range of motion.  Skin:    General: Skin is warm and dry.  Neurological:     Mental Status: He is alert and oriented to person, place, and time. Mental status is at baseline.  Psychiatric:  Mood and Affect: Mood normal.        Behavior: Behavior normal.        Thought Content: Thought content normal.        Judgment: Judgment normal.

## 2023-09-08 ENCOUNTER — Other Ambulatory Visit (HOSPITAL_BASED_OUTPATIENT_CLINIC_OR_DEPARTMENT_OTHER): Payer: Self-pay

## 2023-09-08 ENCOUNTER — Ambulatory Visit: Payer: Commercial Managed Care - PPO | Admitting: Internal Medicine

## 2023-09-08 ENCOUNTER — Encounter: Payer: Self-pay | Admitting: Internal Medicine

## 2023-09-08 VITALS — BP 138/100 | HR 85 | Temp 98.0°F | Ht 72.0 in | Wt 253.0 lb

## 2023-09-08 DIAGNOSIS — I10 Essential (primary) hypertension: Secondary | ICD-10-CM

## 2023-09-08 DIAGNOSIS — R0683 Snoring: Secondary | ICD-10-CM

## 2023-09-08 DIAGNOSIS — E538 Deficiency of other specified B group vitamins: Secondary | ICD-10-CM

## 2023-09-08 MED ORDER — HYDROCHLOROTHIAZIDE 25 MG PO TABS
25.0000 mg | ORAL_TABLET | Freq: Every day | ORAL | 11 refills | Status: DC
  Filled 2023-09-08: qty 30, 30d supply, fill #0
  Filled 2023-10-06: qty 30, 30d supply, fill #1

## 2023-09-08 MED ORDER — VALSARTAN 320 MG PO TABS
320.0000 mg | ORAL_TABLET | Freq: Every day | ORAL | 11 refills | Status: DC
  Filled 2023-09-08: qty 30, 30d supply, fill #0
  Filled 2023-10-06: qty 30, 30d supply, fill #1

## 2023-09-08 NOTE — Assessment & Plan Note (Addendum)
On B12 in a MVI

## 2023-09-08 NOTE — Assessment & Plan Note (Signed)
 R/o OSA Sleep consult

## 2023-09-08 NOTE — Assessment & Plan Note (Signed)
 Not better D/c Hyzaar Start Diovan and hydrochlorothiazide Consider sleep test

## 2023-09-08 NOTE — Progress Notes (Signed)
 Subjective:  Patient ID: Charles Adams, male    DOB: 08-28-1976  Age: 48 y.o. MRN: [id number]  CC: Medical Management of Chronic Issues (Elevated BP even after med change. Pt states he feels pressure in his head and can tell his BP is up due to feeling not to good.)   HPI Charles Adams presents for HTN: 146/106 or so on Hyzaar . Hydrochlorothiazide  was increased to 25 mg/d.  C/o elevated BP even after med change. Pt states he feels pressure in his head and can tell his BP is up due to feeling not to good  Outpatient Medications Prior to Visit  Medication Sig Dispense Refill   Multiple Vitamin (MULTIVITAMIN WITH MINERALS) TABS tablet Take 1 tablet by mouth daily. Vit c, zinc and others     losartan -hydrochlorothiazide  (HYZAAR ) 100-25 MG tablet Take 1 tablet by mouth daily. 90 tablet 1   No facility-administered medications prior to visit.    ROS: Review of Systems  Constitutional:  Negative for appetite change, fatigue and unexpected weight change.  HENT:  Negative for congestion, nosebleeds, sneezing, sore throat and trouble swallowing.   Eyes:  Negative for itching and visual disturbance.  Respiratory:  Negative for cough.   Cardiovascular:  Negative for chest pain, palpitations and leg swelling.  Gastrointestinal:  Negative for abdominal distention, blood in stool, diarrhea and nausea.  Genitourinary:  Negative for frequency and hematuria.  Musculoskeletal:  Negative for back pain, gait problem, joint swelling and neck pain.  Skin:  Negative for rash.  Neurological:  Positive for headaches. Negative for dizziness, tremors, speech difficulty and weakness.  Psychiatric/Behavioral:  Negative for agitation, dysphoric mood and sleep disturbance. The patient is not nervous/anxious.     Objective:  BP (!) 138/100 (BP Location: Left Arm, Patient Position: Sitting, Cuff Size: Normal)   Pulse 85   Temp 98 F (36.7 C) (Oral)   Ht 6' (1.829 m)   Wt 253 lb (114.8 kg)   SpO2 97%    BMI 34.31 kg/m   BP Readings from Last 3 Encounters:  09/08/23 (!) 138/100  09/01/23 (!) 138/100  02/20/23 120/76    Wt Readings from Last 3 Encounters:  09/08/23 253 lb (114.8 kg)  09/01/23 254 lb (115.2 kg)  02/20/23 253 lb (114.8 kg)    Physical Exam Constitutional:      General: He is not in acute distress.    Appearance: He is well-developed.     Comments: NAD  Eyes:     Conjunctiva/sclera: Conjunctivae normal.     Pupils: Pupils are equal, round, and reactive to light.  Neck:     Thyroid : No thyromegaly.     Vascular: No JVD.  Cardiovascular:     Rate and Rhythm: Normal rate and regular rhythm.     Heart sounds: Normal heart sounds. No murmur heard.    No friction rub. No gallop.  Pulmonary:     Effort: Pulmonary effort is normal. No respiratory distress.     Breath sounds: Normal breath sounds. No wheezing or rales.  Chest:     Chest wall: No tenderness.  Abdominal:     General: Bowel sounds are normal. There is no distension.     Palpations: Abdomen is soft. There is no mass.     Tenderness: There is no abdominal tenderness. There is no guarding or rebound.  Musculoskeletal:        General: No tenderness. Normal range of motion.     Cervical back: Normal range of motion.  Lymphadenopathy:     Cervical: No cervical adenopathy.  Skin:    General: Skin is warm and dry.     Findings: No rash.  Neurological:     Mental Status: He is alert and oriented to person, place, and time.     Cranial Nerves: No cranial nerve deficit.     Motor: No abnormal muscle tone.     Coordination: Coordination normal.     Gait: Gait normal.     Deep Tendon Reflexes: Reflexes are normal and symmetric.  Psychiatric:        Behavior: Behavior normal.        Thought Content: Thought content normal.        Judgment: Judgment normal.     Lab Results  Component Value Date   WBC 5.5 02/03/2023   HGB 14.1 02/03/2023   HCT 43.0 02/03/2023   PLT 206.0 02/03/2023   GLUCOSE 91  02/03/2023   CHOL 185 02/03/2023   TRIG 151.0 (H) 02/03/2023   HDL 41.20 02/03/2023   LDLCALC 114 (H) 02/03/2023   ALT 32 02/03/2023   AST 25 02/03/2023   NA 141 02/03/2023   K 4.4 02/03/2023   CL 103 02/03/2023   CREATININE 0.81 02/03/2023   BUN 16 02/03/2023   CO2 29 02/03/2023   TSH 2.62 02/03/2023   PSA 0.61 02/03/2023    DG Abd 1 View Result Date: 02/28/2020 CLINICAL DATA:  Preop for lithotripsy of left ureteral stone. EXAM: ABDOMEN - 1 VIEW COMPARISON:  February 15, 2020.  December 14, 2019. FINDINGS: The bowel gas pattern is normal. Stable calculus is seen in the left side of pelvis consistent with distal left ureteral calculus as noted on prior exam. Several phleboliths are noted as well. IMPRESSION: Negative. Electronically Signed   By: Charles Adams Raddle M.D.   On: 02/28/2020 08:03    Assessment & Plan:   Problem List Items Addressed This Visit     HTN (hypertension) - Primary   Not better D/c Hyzaar  Start Diovan  and hydrochlorothiazide  Consider sleep test      Relevant Medications   valsartan  (DIOVAN ) 320 MG tablet   hydrochlorothiazide  (HYDRODIURIL ) 25 MG tablet   Other Relevant Orders   Ambulatory referral to Pulmonology   B12 deficiency   On B12 in a MVI      Snoring   R/o OSA Sleep consult      Relevant Orders   Ambulatory referral to Pulmonology      Meds ordered this encounter  Medications   valsartan  (DIOVAN ) 320 MG tablet    Sig: Take 1 tablet (320 mg total) by mouth daily.    Dispense:  30 tablet    Refill:  11   hydrochlorothiazide  (HYDRODIURIL ) 25 MG tablet    Sig: Take 1 tablet (25 mg total) by mouth daily.    Dispense:  30 tablet    Refill:  11      Follow-up: No follow-ups on file.  Charles Noel, MD

## 2023-09-12 ENCOUNTER — Encounter (HOSPITAL_BASED_OUTPATIENT_CLINIC_OR_DEPARTMENT_OTHER): Payer: Self-pay | Admitting: Cardiology

## 2023-09-12 ENCOUNTER — Other Ambulatory Visit (HOSPITAL_BASED_OUTPATIENT_CLINIC_OR_DEPARTMENT_OTHER): Payer: Self-pay

## 2023-09-12 ENCOUNTER — Ambulatory Visit (HOSPITAL_BASED_OUTPATIENT_CLINIC_OR_DEPARTMENT_OTHER): Payer: Commercial Managed Care - PPO | Admitting: Cardiology

## 2023-09-12 VITALS — BP 124/78 | HR 103 | Ht 72.0 in | Wt 253.7 lb

## 2023-09-12 DIAGNOSIS — I159 Secondary hypertension, unspecified: Secondary | ICD-10-CM

## 2023-09-12 DIAGNOSIS — Z8249 Family history of ischemic heart disease and other diseases of the circulatory system: Secondary | ICD-10-CM

## 2023-09-12 DIAGNOSIS — R072 Precordial pain: Secondary | ICD-10-CM

## 2023-09-12 DIAGNOSIS — I10 Essential (primary) hypertension: Secondary | ICD-10-CM | POA: Diagnosis not present

## 2023-09-12 MED ORDER — METOPROLOL TARTRATE 100 MG PO TABS
ORAL_TABLET | ORAL | 0 refills | Status: DC
  Filled 2023-09-12 – 2023-09-29 (×2): qty 1, 1d supply, fill #0

## 2023-09-12 NOTE — Patient Instructions (Addendum)
 Medication Instructions:  TAKE  METOPROLOL  100  MG 2 HOURS PRIOR TO CARDIAC CT   Labwork: BMET 1 WEEK PRIOR TO CARDIAC CT  Testing/Procedures: Your physician has requested that you have cardiac CT. Cardiac computed tomography (CT) is a painless test that uses an x-ray machine to take clear, detailed pictures of your heart. For further information please visit [email or web address] Please follow instruction sheet as given.  Follow-Up: AS NEEDED   Any Other Special Instructions Will Be Listed Below (If Applicable).   Your cardiac CT will be scheduled at one of the below locations:   [facility] [address] [place], [place] [address] [phone]  OR  [facility] [address] [place], [place] [address] [phone]  OR   [facility] [address] [place], [place] [address] [phone]  If scheduled at [facility], please arrive at the [facility] and Children's Entrance (Entrance C2) of [facility] 30 minutes prior to test start time. You can use the FREE valet parking offered at entrance C (encouraged to control the heart rate for the test)  Proceed to the [facility] Radiology Department (first floor) to check-in and test prep.  All radiology patients and guests should use entrance C2 at [facility], accessed from [facility], even though the hospital's physical address listed is [address]    If scheduled at [facility] or [facility], please arrive 15 mins early for check-in and test prep.  There is spacious parking and easy access to the radiology department from the [facility] entrance. Please enter here and check-in with the desk attendant.   Please follow these instructions carefully (unless otherwise directed):  An IV will be required for this test and Nitroglycerin will  be given.  Hold all erectile dysfunction medications at least 3 days (72 hrs) prior to test. (Ie viagra, cialis, sildenafil, tadalafil, etc)   On the Night Before the Test: Be sure to Drink plenty of water . Do not consume any caffeinated/decaffeinated beverages or chocolate 12 hours prior to your test. Do not take any antihistamines 12 hours prior to your test.  On the Day of the Test: Drink plenty of water  until 1 hour prior to the test. Do not eat any food 1 hour prior to test. You may take your regular medications prior to the test.  Take metoprolol  (Lopressor ) two hours prior to test. If you take Furosemide /Hydrochlorothiazide/Spironolactone/Chlorthalidone, please HOLD on the morning of the test. Patients who wear a continuous glucose monitor MUST remove the device prior to scanning.      After the Test: Drink plenty of water . After receiving IV contrast, you may experience a mild flushed feeling. This is normal. On occasion, you may experience a mild rash up to 24 hours after the test. This is not dangerous. If this occurs, you can take Benadryl  25 mg and increase your fluid intake. If you experience trouble breathing, this can be serious. If it is severe call 911 IMMEDIATELY. If it is mild, please call our office.  We will call to schedule your test 2-4 weeks out understanding that some insurance companies will need an authorization prior to the service being performed.   For more information and frequently asked questions, please visit our website : [email or web address]  For non-scheduling related questions, please contact the cardiac imaging nurse navigator should you have any questions/concerns: Cardiac Imaging Nurse Navigators Direct  Office Dial: [phone]   For scheduling needs, including cancellations and rescheduling, please call Brittany, [phone].  Cardiac CT Angiogram A cardiac CT angiogram is a procedure to look at the heart and the area around  the heart. It may be done to help find the cause of chest pains or other symptoms of heart disease. During this procedure, a substance called contrast dye is injected into a vein in the arm. The contrast highlights the blood vessels in the area to be checked. A large X-ray machine (CT scanner), then takes detailed pictures of the heart and the surrounding area. The procedure is also sometimes called a coronary CT angiogram, coronary artery scanning, or CTA. A cardiac CT angiogram allows the health care provider to see how well blood is flowing to and from the heart. The provider will be able to see if there are any problems, such as: Blockage or narrowing of the arteries in the heart. Fluid around the heart. Signs of weakness or disease in the muscles, valves, and tissues of the heart. Tell a health care provider about: Any allergies you have. This is especially important if you have had a previous allergic reaction to medicines, contrast dye, or iodine. All medicines you are taking, including vitamins, herbs, eye drops, creams, and over-the-counter medicines. Any bleeding problems you have. Any surgeries you have had. Any medical conditions you have, including kidney problems or kidney failure. Whether you are pregnant or may be pregnant. Any anxiety disorders, chronic pain, or other conditions you have. These may increase your stress or prevent you from lying still. Any history of abnormal heart rhythms or heart procedures. What are the risks? Your provider will talk with you about risks. These may include: Bleeding. Infection. Allergic reactions to medicines or dyes. Damage to other structures or organs. Kidney damage from the contrast dye. Increased risk of cancer from radiation exposure. This risk is low. Talk with your provider about: The risks and benefits of testing. How you can receive the lowest dose of radiation. What happens before the procedure? Wear comfortable clothing and  remove any jewelry, glasses, dentures, and hearing aids. Follow instructions from your provider about eating and drinking. These may include: 12 hours before the procedure Avoid caffeine. This includes tea, coffee, soda, energy drinks, and diet pills. Drink plenty of water  or other fluids that do not have caffeine in them. Being well hydrated can prevent complications. 4-6 hours before the procedure Stop eating and drinking. This will reduce the risk of nausea from the contrast dye. Ask your provider about changing or stopping your regular medicines. These include: Diabetes medicines. Medicines to treat problems with erections (erectile dysfunction). If you have kidney problems, you may need to receive IV hydration before and after the test. What happens during the procedure?  Hair on your chest may need to be removed so that small sticky patches called electrodes can be placed on your chest. These will transmit information that helps to monitor your heart during the procedure. An IV will be inserted into one of your veins. You might be given a medicine to control your heart rate during the procedure. This will help to ensure that good images are obtained. You will be asked to lie on an exam table. This table will slide in and out of the CT machine during the procedure. Contrast dye will be injected into the IV. You might feel warm, or you may get a metallic taste in your mouth. You may be given medicines to relax  or dilate the arteries in your heart. If you are allergic to contrast dyes or iodine you may be given medicine before the test to reduce the risk of an allergic reaction. The table that you are lying on will move into the CT machine tunnel for the scan. The person running the machine will give you instructions while the scans are being done. You may be asked to: Keep your arms above your head. Hold your breath for short periods. Stay very still, even if the table is moving. The  procedure may vary among providers and hospitals. What can I expect after the procedure? After your procedure, it is common to have: A metallic taste in your mouth from the contrast dye. A feeling of warmth. A headache from the heart medicine. Follow these instructions at home: Take over-the-counter and prescription medicines only as told by your provider. If you are told, drink enough fluid to keep your pee pale yellow. This will help to flush the contrast dye out of your body. Most people can return to their normal activities right after the procedure. Ask your provider what activities are safe for you. It is up to you to get the results of your procedure. Ask your provider, or the department that is doing the procedure, when your results will be ready. Contact a health care provider if: You have any symptoms of allergy to the contrast dye. These include: Shortness of breath. Rash or hives. A racing heartbeat. You notice a change in your peeing (urination). This information is not intended to replace advice given to you by your health care provider. Make sure you discuss any questions you have with your health care provider. Document Revised: 03/22/2022 Document Reviewed: 03/22/2022 Elsevier Patient Education  2024 [employer].

## 2023-09-12 NOTE — Progress Notes (Signed)
 Cardiology Office Note:  .   Date:  09/12/2023  ID:  Charles Adams, DOB February 22, 1976, MRN [id number] PCP: Norleen Charles ORN, MD  [facility] Providers Cardiologist:  None     History of Present Illness: .   Charles Adams is a 48 y.o. 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 The patient was previously seen in the clinic three years ago for an abnormal EKG, which is now reported as normal. The patient's hypertension is managed with valsartan  320mg  daily and hydrochlorothiazide  25mg  daily which was recently changed to this regimen by his PCP.  The patient reports experiencing chest and nasal congestion, which initially led him to suspect a sinus infection. He attempted self-treatment with Sudafed, which temporarily alleviated symptoms but did not provide lasting relief. The patient also reports episodes of dizziness when turning his head quickly.  After a week of persistent symptoms, the patient's spouse suggested checking his blood pressure, which was found to be elevated. The patient discontinued Sudafed, suspecting it might be contributing to the high blood pressure. Despite discontinuation, the patient's blood pressure remained elevated, prompting a visit to his primary care provider who adjusted his hypertension medication.  The patient also reports a sensation of heaviness in the chest, which he describes as not being tightness but more of a weight. He has not been able to expectorate any significant amount of mucus, and what has been produced is clear.  The patient also mentions an unusual incident of skin tightening or wrinkling in the chest area while brushing his teeth, which resolved on its own. The patient denies any rashes or other skin changes in that area.  The patient's father had a history of heart issues, including bypass surgery in his fifties,  but was a smoker. The patient does not smoke. The patient's blood pressure has been gradually decreasing since the adjustment in medication, and he reports feeling generally better.          ROS: No syncope no bleeding no orthopnea  Studies Reviewed: SABRA   EKG Interpretation Date/Time:  Friday September 12 2023 08:14:10 EST Ventricular Rate:  83 PR Interval:  138 QRS Duration:  86 QT Interval:  366 QTC Calculation: 430 R Axis:   18  Text Interpretation: Normal sinus rhythm Normal ECG When compared with ECG of 13-Dec-2019 22:59, No significant change since last tracing Confirmed by Jeffrie Anes ([id number]) on 09/12/2023 8:20:23 AM    Results   LABS LDL: 114 (02/03/2023) TSH: 2.62 Creatinine: 0.81 Hemoglobin: 14.1  DIAGNOSTIC EKG: Normal     Risk Assessment/Calculations:            Physical Exam:   VS:  BP 124/78   Pulse (!) 103   Ht 6' (1.829 m)   Wt 253 lb 11.2 oz (115.1 kg)   SpO2 97%   BMI 34.41 kg/m    Wt Readings from Last 3 Encounters:  09/12/23 253 lb 11.2 oz (115.1 kg)  09/08/23 253 lb (114.8 kg)  09/01/23 254 lb (115.2 kg)    GEN: Well nourished, well developed in no acute distress NECK: No JVD; No carotid bruits CARDIAC: RRR, no murmurs, no rubs, no gallops RESPIRATORY:  Clear to auscultation without rales, wheezing or rhonchi  ABDOMEN: Soft, non-tender, non-distended EXTREMITIES:  No edema; No deformity   ASSESSMENT AND PLAN: .    Assessment and  Plan    Hypertension Long-standing hypertension with recent episodes of elevated blood pressure, likely exacerbated by Sudafed. Blood pressure improving with valsartan  320 mg daily and hydrochlorothiazide  25 mg daily.  Agree with current pharmacologic strategy.  Recent EKG normal. Discussed regular monitoring, lifestyle modifications, and factors affecting blood pressure such as anxiety and stress. - Continue valsartan  320 mg daily - Continue hydrochlorothiazide  25 mg daily - Monitor blood pressure regularly -  Encourage regular exercise (30 minutes daily) - Follow Mediterranean diet  Family History of Heart Disease Family history of heart disease, including father's bypass surgery in his fifties. Occasional chest heaviness reported. EKG normal. Discussed coronary CT scan for plaque assessment, its non-invasive nature, and benefits of early detection for preventive measures. - Order coronary CT scan to assess for calcified or soft plaque - Discuss results and potential management changes based on findings (potential for statin use)  Chest Congestion Intermittent chest and nasal congestion with occasional dizziness, temporarily relieved by Sudafed but exacerbating hypertension. Likely related to a lingering respiratory infection. Discussed discontinuing Sudafed due to its impact on blood pressure. - Discontinue Sudafed - Monitor symptoms - Hydrate adequately  General Health Maintenance General health parameters within normal limits. LDL cholesterol 114, TSH 2.62, creatinine 0.81, hemoglobin 14.1. - Continue current medications - Encourage regular exercise and healthy diet  Follow-up - Schedule coronary CT scan - Coordinate with insurance for preauthorization - Follow up with primary care physician for ongoing management - Return to cardiology if needed based on CT scan results.               Signed, Oneil Parchment, MD

## 2023-09-23 ENCOUNTER — Other Ambulatory Visit (HOSPITAL_BASED_OUTPATIENT_CLINIC_OR_DEPARTMENT_OTHER): Payer: Self-pay

## 2023-09-29 ENCOUNTER — Other Ambulatory Visit (HOSPITAL_BASED_OUTPATIENT_CLINIC_OR_DEPARTMENT_OTHER): Payer: Self-pay

## 2023-10-01 ENCOUNTER — Ambulatory Visit (INDEPENDENT_AMBULATORY_CARE_PROVIDER_SITE_OTHER): Payer: Commercial Managed Care - PPO | Admitting: Pulmonary Disease

## 2023-10-01 ENCOUNTER — Encounter (HOSPITAL_BASED_OUTPATIENT_CLINIC_OR_DEPARTMENT_OTHER): Payer: Self-pay | Admitting: Pulmonary Disease

## 2023-10-01 VITALS — BP 118/82 | HR 80 | Resp 16 | Ht 72.0 in | Wt 255.5 lb

## 2023-10-01 DIAGNOSIS — I10 Essential (primary) hypertension: Secondary | ICD-10-CM | POA: Diagnosis not present

## 2023-10-01 DIAGNOSIS — G4733 Obstructive sleep apnea (adult) (pediatric): Secondary | ICD-10-CM | POA: Diagnosis not present

## 2023-10-01 NOTE — Patient Instructions (Signed)
X home sleep test

## 2023-10-01 NOTE — Progress Notes (Signed)
Subjective:    Patient ID: Charles Adams, male    DOB: 09-Sep-1975, 48 y.o.   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 Epworth Sleepiness Scale is 13 and reports sleepiness while sitting and reading, passenger in a car, lying down to rest in the afternoons. Bedtime is between 10 and 10:30 PM, sleep latency about 30 minutes, he sleeps on his back on his side in all positions, reports minimal awakenings through the night and is out of bed by 5:30 AM feeling rested without dryness of mouth or headache There is no history suggestive of cataplexy, sleep paralysis or parasomnias  Discussed the use of AI scribe software for clinical note transcription with the patient, who gave verbal consent to proceed.  History of Present Illness   The patient, with a history of hypertension, presents with snoring and daytime sleepiness. He reports that his spouse has noticed changes in his snoring pattern over the past year, but no pauses or cessation of breathing. The patient also reports occasional episodes of waking up with what he describes as a 'panic attack,' but it is unclear if this is related to gasping for breath. He has also noticed an increased tendency to doze off while watching TV with his family. Despite these symptoms, the patient reports that he usually sleeps through the night and wakes up feeling refreshed most of the time. He denies morning headaches or dry mouth upon waking. The patient also reports that he is on two medications for hypertension.          Epworth Sleepiness Scale  Use the following scale to choose the most appropriate number for each situation. 0 Would never nod off 1  Slight  chance of nodding off 2 Moderate chance of nodding off 3 High chance of nodding off  Sitting and reading: 3 Watching TV: 1 Sitting, inactive, in  a public place (e.g., in a meeting, theater, or dinner event): 1 As a passenger in a car for an hour or more without stopping for a break: 2 Lying down to rest when circumstances permit:1 Sitting and talking to someone: 1 Sitting quietly after a meal without alcohol: 2 In a car, while stopped for a few  minutes in traffic or at a light: 1  TOTOAL: 12   Past Medical History:  Diagnosis Date   Allergic rhinitis, cause unspecified    Allergy    History of kidney stones    HTN (hypertension) 04/02/2020   Hypertension    Numerous moles 12/15/2013   Past Surgical History:  Procedure Laterality Date   EXTRACORPOREAL SHOCK WAVE LITHOTRIPSY Left 02/28/2020   Procedure: EXTRACORPOREAL SHOCK WAVE LITHOTRIPSY (ESWL);  Surgeon: Heloise Purpura, MD;  Location: [facility];  Service: Urology;  Laterality: Left;   eye surgury Right 48yo   right eye metal fragment removal   WISDOM TOOTH EXTRACTION      No Known Allergies  Social History   Socioeconomic History   Marital status: Married    Spouse name: Not on file   Number of children: 2   Years of education: college   Highest education level: Not on file  Occupational History   Occupation: Curator, Hospital    Employer: [employer]  Tobacco Use   Smoking status: Never   Smokeless tobacco: Never  Vaping Use   Vaping status: Never Used  Substance and Sexual  Activity   Alcohol use: No   Drug use: No   Sexual activity: Yes    Birth control/protection: Condom    Comment: Only spouse  Other Topics Concern   Not on file  Social History Narrative   Not on file   Social Drivers of Health   Financial Resource Strain: Not on file  Food Insecurity: Not on file  Transportation Needs: Not on file  Physical Activity: Not on file  Stress: Not on file  Social Connections: Not on file  Intimate Partner Violence: Not on file    Family History  Problem Relation Age of Onset   Hypertension Mother    Cancer Father         pancreatic cancer   Hypertension Father    Heart disease Father    Heart disease Other    Diabetes Other    Arthritis Other    Cancer Other        lung cancer   Colon cancer Neg Hx    Colon polyps Neg Hx    Esophageal cancer Neg Hx    Rectal cancer Neg Hx    Stomach cancer Neg Hx       Review of Systems Constitutional: negative for anorexia, fevers and sweats  Eyes: negative for irritation, redness and visual disturbance  Ears, nose, mouth, throat, and face: negative for earaches, epistaxis, nasal congestion and sore throat  Respiratory: negative for cough, dyspnea on exertion, sputum and wheezing  Cardiovascular: negative for chest pain, dyspnea, lower extremity edema, orthopnea, palpitations and syncope  Gastrointestinal: negative for abdominal pain, constipation, diarrhea, melena, nausea and vomiting  Genitourinary:negative for dysuria, frequency and hematuria  Hematologic/lymphatic: negative for bleeding, easy bruising and lymphadenopathy  Musculoskeletal:negative for arthralgias, muscle weakness and stiff joints  Neurological: negative for coordination problems, gait problems, headaches and weakness  Endocrine: negative for diabetic symptoms including polydipsia, polyuria and weight loss     Objective:   Physical Exam  Gen. Pleasant, obese, in no distress, normal affect ENT - no pallor,icterus, no post nasal drip, class 2-3 airway Neck: No JVD, no thyromegaly, no carotid bruits Lungs: no use of accessory muscles, no dullness to percussion, decreased without rales or rhonchi  Cardiovascular: Rhythm regular, heart sounds  normal, no murmurs or gallops, no peripheral edema Abdomen: soft and non-tender, no hepatosplenomegaly, BS normal. Musculoskeletal: No deformities, no cyanosis or clubbing Neuro:  alert, non focal, no tremors       Assessment & Plan:    Assessment and Plan    Suspected Obstructive Sleep Apnea (OSA) Presents with loud snoring, daytime  sleepiness, and occasional nocturnal panic-like episodes. No observed apneas. Physical exam shows a slightly narrow airway. Differential includes mild to moderate OSA. Explained OSA pathophysiology, diagnostic criteria, and potential treatments. - Order home sleep test - Obtain insurance clearance for the test - Review results within 5-7 days post-test and provide recommendations  Hypertension On two antihypertensive medications, Family history of hypertension. - Continue current antihypertensive regimen - Proceed with scheduled CT scan for heart evaluation  General Health Maintenance No significant weight changes, non-smoker, seasonal allergies. Family history of diabetes and heart disease. - Encourage regular follow-up for blood pressure management - Advise on maintaining a healthy lifestyle, including diet and exercise  Follow-up - Follow up within 5-7 days after home sleep test results - Contact if no communication within 3-4 weeks regarding the home sleep test.

## 2023-10-03 ENCOUNTER — Ambulatory Visit: Payer: Commercial Managed Care - PPO | Admitting: Internal Medicine

## 2023-10-08 ENCOUNTER — Ambulatory Visit: Payer: Commercial Managed Care - PPO

## 2023-10-08 DIAGNOSIS — G4733 Obstructive sleep apnea (adult) (pediatric): Secondary | ICD-10-CM

## 2023-10-20 ENCOUNTER — Encounter: Payer: Self-pay | Admitting: Internal Medicine

## 2023-10-20 ENCOUNTER — Other Ambulatory Visit (HOSPITAL_BASED_OUTPATIENT_CLINIC_OR_DEPARTMENT_OTHER): Payer: Self-pay

## 2023-10-20 ENCOUNTER — Ambulatory Visit: Payer: Commercial Managed Care - PPO | Admitting: Internal Medicine

## 2023-10-20 VITALS — BP 118/76 | HR 77 | Temp 98.1°F | Ht 72.0 in | Wt 257.0 lb

## 2023-10-20 DIAGNOSIS — R739 Hyperglycemia, unspecified: Secondary | ICD-10-CM

## 2023-10-20 DIAGNOSIS — Z0001 Encounter for general adult medical examination with abnormal findings: Secondary | ICD-10-CM

## 2023-10-20 DIAGNOSIS — E7800 Pure hypercholesterolemia, unspecified: Secondary | ICD-10-CM | POA: Diagnosis not present

## 2023-10-20 DIAGNOSIS — Z125 Encounter for screening for malignant neoplasm of prostate: Secondary | ICD-10-CM

## 2023-10-20 DIAGNOSIS — Z0000 Encounter for general adult medical examination without abnormal findings: Secondary | ICD-10-CM | POA: Diagnosis not present

## 2023-10-20 DIAGNOSIS — E538 Deficiency of other specified B group vitamins: Secondary | ICD-10-CM

## 2023-10-20 DIAGNOSIS — E559 Vitamin D deficiency, unspecified: Secondary | ICD-10-CM

## 2023-10-20 DIAGNOSIS — J309 Allergic rhinitis, unspecified: Secondary | ICD-10-CM

## 2023-10-20 DIAGNOSIS — I10 Essential (primary) hypertension: Secondary | ICD-10-CM | POA: Diagnosis not present

## 2023-10-20 LAB — URINALYSIS, ROUTINE W REFLEX MICROSCOPIC
Bilirubin Urine: NEGATIVE
Hgb urine dipstick: NEGATIVE
Ketones, ur: NEGATIVE
Leukocytes,Ua: NEGATIVE
Nitrite: NEGATIVE
RBC / HPF: NONE SEEN (ref 0–?)
Specific Gravity, Urine: 1.01 (ref 1.000–1.030)
Total Protein, Urine: NEGATIVE
Urine Glucose: NEGATIVE
Urobilinogen, UA: 0.2 (ref 0.0–1.0)
WBC, UA: NONE SEEN (ref 0–?)
pH: 7 (ref 5.0–8.0)

## 2023-10-20 LAB — BASIC METABOLIC PANEL
BUN: 14 mg/dL (ref 6–23)
CO2: 27 meq/L (ref 19–32)
Calcium: 8.8 mg/dL (ref 8.4–10.5)
Chloride: 104 meq/L (ref 96–112)
Creatinine, Ser: 0.72 mg/dL (ref 0.40–1.50)
GFR: 108.68 mL/min (ref >60.00)
Glucose, Bld: 92 mg/dL (ref 70–99)
Potassium: 4.6 meq/L (ref 3.5–5.1)
Sodium: 140 meq/L (ref 135–145)

## 2023-10-20 LAB — HEPATIC FUNCTION PANEL
ALT: 40 U/L (ref 0–53)
AST: 27 U/L (ref 0–37)
Albumin: 4.2 g/dL (ref 3.5–5.2)
Alkaline Phosphatase: 53 U/L (ref 39–117)
Bilirubin, Direct: 0.2 mg/dL (ref 0.0–0.3)
Total Bilirubin: 1 mg/dL (ref 0.2–1.2)
Total Protein: 6.7 g/dL (ref 6.0–8.3)

## 2023-10-20 LAB — LIPID PANEL
Cholesterol: 180 mg/dL (ref 0–200)
HDL: 41.9 mg/dL (ref >39.00)
LDL Cholesterol: 108 mg/dL — ABNORMAL HIGH (ref 0–99)
NonHDL: 138.02
Total CHOL/HDL Ratio: 4
Triglycerides: 150 mg/dL — ABNORMAL HIGH (ref 0.0–149.0)
VLDL: 30 mg/dL (ref 0.0–40.0)

## 2023-10-20 LAB — CBC WITH DIFFERENTIAL/PLATELET
Basophils Absolute: 0 10*3/uL (ref 0.0–0.1)
Basophils Relative: 0.4 % (ref 0.0–3.0)
Eosinophils Absolute: 0.1 10*3/uL (ref 0.0–0.7)
Eosinophils Relative: 2 % (ref 0.0–5.0)
HCT: 42.4 % (ref 39.0–52.0)
Hemoglobin: 14.3 g/dL (ref 13.0–17.0)
Lymphocytes Relative: 23 % (ref 12.0–46.0)
Lymphs Abs: 1.2 10*3/uL (ref 0.7–4.0)
MCHC: 33.8 g/dL (ref 30.0–36.0)
MCV: 90.1 fL (ref 78.0–100.0)
Monocytes Absolute: 0.5 10*3/uL (ref 0.1–1.0)
Monocytes Relative: 9 % (ref 3.0–12.0)
Neutro Abs: 3.5 10*3/uL (ref 1.4–7.7)
Neutrophils Relative %: 65.6 % (ref 43.0–77.0)
Platelets: 213 10*3/uL (ref 150.0–400.0)
RBC: 4.71 Mil/uL (ref 4.22–5.81)
RDW: 13.2 % (ref 11.5–15.5)
WBC: 5.3 10*3/uL (ref 4.0–10.5)

## 2023-10-20 LAB — PSA: PSA: 1.15 ng/mL (ref 0.10–4.00)

## 2023-10-20 LAB — HEMOGLOBIN A1C: Hgb A1c MFr Bld: 5.8 % (ref 4.6–6.5)

## 2023-10-20 LAB — VITAMIN D 25 HYDROXY (VIT D DEFICIENCY, FRACTURES): VITD: 73 ng/mL (ref 30.00–100.00)

## 2023-10-20 LAB — TSH: TSH: 2.06 u[IU]/mL (ref 0.35–5.50)

## 2023-10-20 LAB — VITAMIN B12: Vitamin B-12: 254 pg/mL (ref 211–911)

## 2023-10-20 MED ORDER — VALSARTAN 320 MG PO TABS
320.0000 mg | ORAL_TABLET | Freq: Every day | ORAL | 3 refills | Status: AC
  Filled 2023-10-20 – 2023-11-03 (×2): qty 90, 90d supply, fill #0
  Filled 2024-02-06: qty 90, 90d supply, fill #1
  Filled 2024-05-04: qty 90, 90d supply, fill #2
  Filled 2024-08-03: qty 90, 90d supply, fill #3

## 2023-10-20 MED ORDER — HYDROCHLOROTHIAZIDE 25 MG PO TABS
25.0000 mg | ORAL_TABLET | Freq: Every day | ORAL | 3 refills | Status: AC
  Filled 2023-10-20 – 2023-11-03 (×2): qty 90, 90d supply, fill #0
  Filled 2024-02-06: qty 90, 90d supply, fill #1
  Filled 2024-05-04: qty 90, 90d supply, fill #2
  Filled 2024-08-03: qty 90, 90d supply, fill #3

## 2023-10-20 NOTE — Patient Instructions (Addendum)
 Please continue all other medications as before, and refills have been done if requested.  Please have the pharmacy call with any other refills you may need.  Please continue your efforts at being more active, low cholesterol diet, and weight control.  You are otherwise up to date with prevention measures today.  Please keep your appointments with your specialists as you may have planned - the heart testing, as well as the pulmonary sleep test  Please go to the LAB at the blood drawing area for the tests to be done  You will be contacted by phone if any changes need to be made immediately.  Otherwise, you will receive a letter about your results with an explanation, but please check with MyChart first.  Please make an Appointment to return for your 1 year visit, or sooner if needed

## 2023-10-20 NOTE — Progress Notes (Signed)
 Patient ID: Charles Adams, male   DOB: 30-Jun-1976, 48 y.o.   MRN: [id number]         Chief Complaint:: wellness exam and possible osa, low vit d, hld, htn, low b12, allergies       HPI:  Charles Adams is a 48 y.o. male here for wellness exam;up to date                 Also Pt denies chest pain, increased sob or doe, wheezing, orthopnea, PND, increased LE swelling, palpitations, dizziness or syncope.   Pt denies polydipsia, polyuria, or new focal neuro s/s.    Pt denies fever, wt loss, night sweats, loss of appetite, or other constitutional symptoms  Is currently being evaluated for possible sleep apnea, and has f/u with cardiology as well soon.  Does have several wks ongoing nasal allergy symptoms with clearish congestion, itch and sneezing, without fever, pain, ST, cough, swelling or wheezing.   Wt Readings from Last 3 Encounters:  10/20/23 257 lb (116.6 kg)  10/01/23 255 lb 8 oz (115.9 kg)  09/12/23 253 lb 11.2 oz (115.1 kg)   BP Readings from Last 3 Encounters:  10/20/23 118/76  10/01/23 118/82  09/12/23 124/78   Immunization History  Administered Date(s) Administered   Influenza, Seasonal, Injecte, Preservative Fre 06/20/2023   Influenza-Unspecified 06/16/2018, 05/30/2020, 06/04/2021, 06/14/2022   PFIZER(Purple Top)SARS-COV-2 Vaccination 08/16/2019, 09/03/2019, 08/10/2020   Tdap 12/20/2014  There are no preventive care reminders to display for this patient.    Past Medical History:  Diagnosis Date   Allergic rhinitis, cause unspecified    Allergy    History of kidney stones    HTN (hypertension) 04/02/2020   Hypertension    Numerous moles 12/15/2013   Past Surgical History:  Procedure Laterality Date   EXTRACORPOREAL SHOCK WAVE LITHOTRIPSY Left 02/28/2020   Procedure: EXTRACORPOREAL SHOCK WAVE LITHOTRIPSY (ESWL);  Surgeon: Heloise Purpura, MD;  Location: [facility];  Service: Urology;  Laterality: Left;   eye surgury Right 48yo   right eye metal fragment  removal   WISDOM TOOTH EXTRACTION      reports that he has never smoked. He has never used smokeless tobacco. He reports that he does not drink alcohol and does not use drugs. family history includes Arthritis in an other family member; Cancer in his father and another family member; Diabetes in an other family member; Heart disease in his father and another family member; Hypertension in his father and mother. No Known Allergies Current Outpatient Medications on File Prior to Visit  Medication Sig Dispense Refill   Multiple Vitamin (MULTIVITAMIN WITH MINERALS) TABS tablet Take 1 tablet by mouth daily. Vit c, zinc and others     metoprolol tartrate (LOPRESSOR) 100 MG tablet Take 1 tablet (100mg ) TWO hours prior to CT scan (Patient not taking: Reported on 10/20/2023) 1 tablet 0   No current facility-administered medications on file prior to visit.        ROS:  All others reviewed and negative.  Objective        PE:  BP 118/76 (BP Location: Right Arm, Patient Position: Sitting, Cuff Size: Normal)   Pulse 77   Temp 98.1 F (36.7 C) (Oral)   Ht 6' (1.829 m)   Wt 257 lb (116.6 kg)   SpO2 98%   BMI 34.86 kg/m                 Constitutional: Pt appears in NAD  HENT: Head: NCAT.                Right Ear: External ear normal.                 Left Ear: External ear normal.                Eyes: . Pupils are equal, round, and reactive to light. Conjunctivae and EOM are normal               Nose: without d/c or deformity               Neck: Neck supple. Gross normal ROM               Cardiovascular: Normal rate and regular rhythm.                 Pulmonary/Chest: Effort normal and breath sounds without rales or wheezing.                Abd:  Soft, NT, ND, + BS, no organomegaly               Neurological: Pt is alert. At baseline orientation, motor grossly intact               Skin: Skin is warm. No rashes, no other new lesions, LE edema - none               Psychiatric: Pt  behavior is normal without agitation   Micro: none  Cardiac tracings I have personally interpreted today:  none  Pertinent Radiological findings (summarize): none   Lab Results  Component Value Date   WBC 5.3 10/20/2023   HGB 14.3 10/20/2023   HCT 42.4 10/20/2023   PLT 213.0 10/20/2023   GLUCOSE 92 10/20/2023   CHOL 180 10/20/2023   TRIG 150.0 (H) 10/20/2023   HDL 41.90 10/20/2023   LDLCALC 108 (H) 10/20/2023   ALT 40 10/20/2023   AST 27 10/20/2023   NA 140 10/20/2023   K 4.6 10/20/2023   CL 104 10/20/2023   CREATININE 0.72 10/20/2023   BUN 14 10/20/2023   CO2 27 10/20/2023   TSH 2.06 10/20/2023   PSA 1.15 10/20/2023   HGBA1C 5.8 10/20/2023   Assessment/Plan:  ZACCHEAUS STORLIE is a 48 y.o. White or Caucasian [1] male with  has a past medical history of Allergic rhinitis, cause unspecified, Allergy, History of kidney stones, HTN (hypertension) (04/02/2020), Hypertension, and Numerous moles (12/15/2013).  Encounter for well adult exam with abnormal findings Age and sex appropriate education and counseling updated with regular exercise and diet Referrals for preventative services - none needed Immunizations addressed - none needed Smoking counseling  - none needed Evidence for depression or other mood disorder - none significant Most recent labs reviewed. I have personally reviewed and have noted: 1) the patient's medical and social history 2) The patient's current medications and supplements 3) The patient's height, weight, and BMI have been recorded in the chart   Vitamin D deficiency Last vitamin D Lab Results  Component Value Date   VD25OH 73.00 10/20/2023   Stable, cont oral replacement   Hyperlipidemia Lab Results  Component Value Date   LDLCALC 108 (H) 10/20/2023   uncontrolled, pt for lower chol diet, declines statin for now  HTN (hypertension) BP Readings from Last 3 Encounters:  10/20/23 118/76  10/01/23 118/82  09/12/23 124/78   Stable, pt  to continue medical treatment hct 25 every day, diovan  320 qd   B12 deficiency Lab Results  Component Value Date   [name] 254 10/20/2023   Low, to start oral replacement - b12 1000 mcg qd   Allergic rhinitis Mild to mod, for nasacort asd,,  to f/u any worsening symptoms or concerns  Followup: Return in about 1 year (around 10/19/2024).  Oliver Barre, MD 10/25/2023 4:02 PM [facility]

## 2023-10-25 ENCOUNTER — Telehealth: Payer: Self-pay | Admitting: Pulmonary Disease

## 2023-10-25 ENCOUNTER — Encounter: Payer: Self-pay | Admitting: Internal Medicine

## 2023-10-25 DIAGNOSIS — G4731 Primary central sleep apnea: Secondary | ICD-10-CM | POA: Diagnosis not present

## 2023-10-25 DIAGNOSIS — G4733 Obstructive sleep apnea (adult) (pediatric): Secondary | ICD-10-CM

## 2023-10-25 NOTE — Assessment & Plan Note (Signed)
 Lab Results  Component Value Date   [name] 254 10/20/2023   Low, to start oral replacement - b12 1000 mcg qd

## 2023-10-25 NOTE — Assessment & Plan Note (Signed)
 BP Readings from Last 3 Encounters:  10/20/23 118/76  10/01/23 118/82  09/12/23 124/78   Stable, pt to continue medical treatment hct 25 every day, diovan 320 qd

## 2023-10-25 NOTE — Assessment & Plan Note (Signed)
 Last vitamin D Lab Results  Component Value Date   VD25OH 73.00 10/20/2023   Stable, cont oral replacement

## 2023-10-25 NOTE — Telephone Encounter (Signed)
 HST showed severe  OSA with AHI 38/ hr & low sat of 80% Few centrals noted , hence suggest CPAP titration study to decide exact level of pressure Based on this we can initiate CPAP therapy Ok to order if he is willing

## 2023-10-25 NOTE — Assessment & Plan Note (Signed)
 Lab Results  Component Value Date   LDLCALC 108 (H) 10/20/2023   uncontrolled, pt for lower chol diet, declines statin for now

## 2023-10-25 NOTE — Assessment & Plan Note (Signed)
Mild to mod, for nasacort asd,,  to f/u any worsening symptoms or concerns 

## 2023-10-25 NOTE — Assessment & Plan Note (Signed)

## 2023-10-27 NOTE — Telephone Encounter (Signed)
 Pt notified and Titration study ordered

## 2023-10-28 ENCOUNTER — Telehealth: Payer: Self-pay | Admitting: Pulmonary Disease

## 2023-10-28 ENCOUNTER — Telehealth: Payer: Self-pay | Admitting: Cardiology

## 2023-10-28 DIAGNOSIS — G4733 Obstructive sleep apnea (adult) (pediatric): Secondary | ICD-10-CM

## 2023-10-28 NOTE — Telephone Encounter (Signed)
 All that was ordered was titration study per Dr Vassie Loll note from 10/27/2023 HST showed severe  OSA with AHI 38/ hr & low sat of 80% Few centrals noted , hence suggest CPAP titration study to decide exact level of pressure Based on this we can initiate CPAP therapy

## 2023-10-28 NOTE — Telephone Encounter (Signed)
  [place], Alma Friend[place], [place]; [place], [place]; [place], Tammy Thank you for the order, I do not see his sleep test results in his chart, also we need settings on the script for his cpap if this is to be an auto it will need auto settings with the range. please advise thank you .  The HST is not on epic for this order to be processed.

## 2023-10-28 NOTE — Telephone Encounter (Signed)
 Pt called in about his CT Morph scheduled for 11/11/23. He would like to have CPT code to give his insurance to see what they would cover. Please advise.

## 2023-10-31 NOTE — Addendum Note (Signed)
 Addended by: Amada Kingfisher on: 10/31/2023 09:04 AM   Modules accepted: Orders

## 2023-10-31 NOTE — Telephone Encounter (Signed)
 Order for cpap titration needs to be corrected   Order came through as a DME order  Please advise

## 2023-10-31 NOTE — Telephone Encounter (Signed)
 Order corrected

## 2023-11-03 ENCOUNTER — Other Ambulatory Visit (HOSPITAL_BASED_OUTPATIENT_CLINIC_OR_DEPARTMENT_OTHER): Payer: Self-pay

## 2023-11-04 NOTE — Telephone Encounter (Signed)
 Patient states received a call from insurance stating CPAP titration would not be covered since patient recently has a sleep study done. Patient would rather not pay out of pocket for the 12/31/23 appointment.  Please advise and cancel 4/30 appointment for CPAP titration and call patient back.

## 2023-11-06 ENCOUNTER — Encounter (HOSPITAL_COMMUNITY): Payer: Self-pay

## 2023-11-11 ENCOUNTER — Ambulatory Visit (HOSPITAL_COMMUNITY)
Admission: RE | Admit: 2023-11-11 | Discharge: 2023-11-11 | Disposition: A | Discharge status: Home / Self Care | Payer: Commercial Managed Care - PPO | Source: Ambulatory Visit | Attending: Cardiology | Admitting: Cardiology

## 2023-11-11 DIAGNOSIS — R072 Precordial pain: Secondary | ICD-10-CM | POA: Diagnosis not present

## 2023-11-11 DIAGNOSIS — I159 Secondary hypertension, unspecified: Secondary | ICD-10-CM | POA: Diagnosis not present

## 2023-11-11 MED ORDER — NITROGLYCERIN 0.4 MG SL SUBL
SUBLINGUAL_TABLET | SUBLINGUAL | Status: AC
  Filled 2023-11-11: qty 2

## 2023-11-11 MED ORDER — DILTIAZEM HCL 25 MG/5ML IV SOLN: 10.0000 mg | INTRAVENOUS | Status: DC | PRN

## 2023-11-11 MED ORDER — METOPROLOL TARTRATE 5 MG/5ML IV SOLN: 10.0000 mg | Freq: Once | INTRAVENOUS | Status: DC | PRN

## 2023-11-11 MED ORDER — IOHEXOL 350 MG/ML SOLN
100.0000 mL | Freq: Once | INTRAVENOUS | Status: AC | PRN
  Administered 2023-11-11: 100 mL via INTRAVENOUS

## 2023-11-11 MED ORDER — NITROGLYCERIN 0.4 MG SL SUBL
0.8000 mg | SUBLINGUAL_TABLET | Freq: Once | SUBLINGUAL | Status: AC
  Administered 2023-11-11: 0.8 mg via SUBLINGUAL

## 2023-11-12 ENCOUNTER — Encounter (HOSPITAL_BASED_OUTPATIENT_CLINIC_OR_DEPARTMENT_OTHER): Payer: Self-pay | Admitting: Cardiology

## 2023-11-12 DIAGNOSIS — E785 Hyperlipidemia, unspecified: Secondary | ICD-10-CM

## 2023-11-13 NOTE — Telephone Encounter (Signed)
Routing to correct triage pool

## 2023-11-18 NOTE — Addendum Note (Signed)
 Addended by: Amada Kingfisher on: 11/18/2023 03:46 PM   Modules accepted: Orders

## 2023-11-18 NOTE — Telephone Encounter (Signed)
 Orders done

## 2023-11-18 NOTE — Telephone Encounter (Signed)
 CPAP order    Zott, Lucita Lora, [place]; [place], Tammy; [place], [place], I pulled the sleep study, thank you but we also need the pressures for the cpap the order just states to titrate to optimal pressure but we need a pressure range notes EX: 5-20.  If you want to give it to me in this message we can add it to CMN for the doctor signature or the order could also be updated. Please advise. Thank You

## 2023-11-27 ENCOUNTER — Telehealth (HOSPITAL_BASED_OUTPATIENT_CLINIC_OR_DEPARTMENT_OTHER): Payer: Self-pay

## 2023-11-27 NOTE — Telephone Encounter (Signed)
 Copied from CRM [id number]. Topic: Clinical - Prescription Issue >> Nov 27, 2023  3:37 PM Isabell A wrote: Reason for CRM: Patient received call from [facility] in regard to his CPAP machine, he wants to confirm if they're in network - down payment is $500+.  Callback number: [phone]

## 2023-12-01 ENCOUNTER — Institutional Professional Consult (permissible substitution) (HOSPITAL_BASED_OUTPATIENT_CLINIC_OR_DEPARTMENT_OTHER): Payer: Commercial Managed Care - PPO | Admitting: Pulmonary Disease

## 2023-12-04 DIAGNOSIS — G4733 Obstructive sleep apnea (adult) (pediatric): Secondary | ICD-10-CM | POA: Diagnosis not present

## 2023-12-04 DIAGNOSIS — I10 Essential (primary) hypertension: Secondary | ICD-10-CM | POA: Diagnosis not present

## 2023-12-17 ENCOUNTER — Other Ambulatory Visit (HOSPITAL_BASED_OUTPATIENT_CLINIC_OR_DEPARTMENT_OTHER): Payer: Self-pay

## 2023-12-17 ENCOUNTER — Telehealth: Admitting: Physician Assistant

## 2023-12-17 DIAGNOSIS — J069 Acute upper respiratory infection, unspecified: Secondary | ICD-10-CM | POA: Diagnosis not present

## 2023-12-17 MED ORDER — PROMETHAZINE-DM 6.25-15 MG/5ML PO SYRP
5.0000 mL | ORAL_SOLUTION | Freq: Four times a day (QID) | ORAL | 0 refills | Status: DC | PRN
  Filled 2023-12-17: qty 118, 6d supply, fill #0

## 2023-12-17 MED ORDER — FLUTICASONE PROPIONATE 50 MCG/ACT NA SUSP
2.0000 | Freq: Every day | NASAL | 0 refills | Status: DC
  Filled 2023-12-17: qty 16, 30d supply, fill #0

## 2023-12-17 NOTE — Progress Notes (Signed)
 E-Visit for Upper Respiratory Infection   We are sorry you are not feeling well.  Here is how we plan to help!  Based on what you have shared with me, it looks like you may have a viral upper respiratory infection.  Upper respiratory infections are caused by a large number of viruses; however, rhinovirus is the most common cause.   Symptoms vary from person to person, with common symptoms including sore throat, cough, fatigue or lack of energy and feeling of general discomfort.  A low-grade fever of up to 100.4 may present, but is often uncommon.  Symptoms vary however, and are closely related to a person's age or underlying illnesses.  The most common symptoms associated with an upper respiratory infection are nasal discharge or congestion, cough, sneezing, headache and pressure in the ears and face.  These symptoms usually persist for about 3 to 10 days, but can last up to 2 weeks.  It is important to know that upper respiratory infections do not cause serious illness or complications in most cases.    Upper respiratory infections can be transmitted from person to person, with the most common method of transmission being a person's hands.  The virus is able to live on the skin and can infect other persons for up to 2 hours after direct contact.  Also, these can be transmitted when someone coughs or sneezes; thus, it is important to cover the mouth to reduce this risk.  To keep the spread of the illness at bay, good hand hygiene is very important.  This is an infection that is most likely caused by a virus. There are no specific treatments other than to help you with the symptoms until the infection runs its course.  We are sorry you are not feeling well.  Here is how we plan to help!   For nasal congestion, you may use an oral decongestants such as Mucinex D or if you have glaucoma or high blood pressure use plain Mucinex.  Saline nasal spray or nasal drops can help and can safely be used as often as  needed for congestion.  For your congestion, I have prescribed Fluticasone nasal spray one spray in each nostril twice a day  If you do not have a history of heart disease, hypertension, diabetes or thyroid disease, prostate/bladder issues or glaucoma, you may also use Sudafed to treat nasal congestion.  It is highly recommended that you consult with a pharmacist or your primary care physician to ensure this medication is safe for you to take.     For cough I have prescribed for you Promethazine DM cough syrup Take 5mL every 6 hours as needed for cough.   If you have a sore or scratchy throat, use a saltwater gargle-  to  teaspoon of salt dissolved in a 4-ounce to 8-ounce glass of warm water.  Gargle the solution for approximately 15-30 seconds and then spit.  It is important not to swallow the solution.  You can also use throat lozenges/cough drops and Chloraseptic spray to help with throat pain or discomfort.  Warm or cold liquids can also be helpful in relieving throat pain.  For headache, pain or general discomfort, you can use Ibuprofen or Tylenol as directed.   Some authorities believe that zinc sprays or the use of Echinacea may shorten the course of your symptoms.   HOME CARE Only take medications as instructed by your medical team. Be sure to drink plenty of fluids. Water is fine as  well as fruit juices, sodas and electrolyte beverages. You may want to stay away from caffeine or alcohol. If you are nauseated, try taking small sips of liquids. How do you know if you are getting enough fluid? Your urine should be a pale yellow or almost colorless. Get rest. Taking a steamy shower or using a humidifier may help nasal congestion and ease sore throat pain. You can place a towel over your head and breathe in the steam from hot water coming from a faucet. Using a saline nasal spray works much the same way. Cough drops, hard candies and sore throat lozenges may ease your cough. Avoid close  contacts especially the very young and the elderly Cover your mouth if you cough or sneeze Always remember to wash your hands.   GET HELP RIGHT AWAY IF: You develop worsening fever. If your symptoms do not improve within 10 days You develop yellow or green discharge from your nose over 3 days. You have coughing fits You develop a severe head ache or visual changes. You develop shortness of breath, difficulty breathing or start having chest pain Your symptoms persist after you have completed your treatment plan  MAKE SURE YOU  Understand these instructions. Will watch your condition. Will get help right away if you are not doing well or get worse.  Thank you for choosing an e-visit.  Your e-visit answers were reviewed by a board certified advanced clinical practitioner to complete your personal care plan. Depending upon the condition, your plan could have included both over the counter or prescription medications.  Please review your pharmacy choice. Make sure the pharmacy is open so you can pick up prescription now. If there is a problem, you may contact your provider through [employer] and have the prescription routed to another pharmacy.  Your safety is important to [place]. If you have drug allergies check your prescription carefully.   For the next 24 hours you can use MyChart to ask questions about today's visit, request a non-urgent call back, or ask for a work or school excuse. You will get an email in the next two days asking about your experience. I hope that your e-visit has been valuable and will speed your recovery.     I have spent 5 minutes in review of e-visit questionnaire, review and updating patient chart, medical decision making and response to patient.   Margaretann Loveless, PA-C

## 2023-12-30 ENCOUNTER — Ambulatory Visit (HOSPITAL_BASED_OUTPATIENT_CLINIC_OR_DEPARTMENT_OTHER)

## 2023-12-30 NOTE — Progress Notes (Deleted)
 Office Visit    Patient Name: Charles Adams Date of Encounter: 12/30/2023  Primary Care Provider:  Roslyn Coombe, MD Primary Cardiologist:  None  Chief Complaint    Hyperlipidemia   Significant Past Medical History   HTN Controlled on valsartan  and hctz - followed by PCP                 No Known Allergies  History of Present Illness    Charles Adams is a 48 y.o. male patient of Dr Renna Cary, in the office today to discuss options for cholesterol management.  He does not have a personal history of CAD, with a CAC = 0, however it did show minimal (<25%) plaque in the LAD, with a total plaque volume of 28 mm3 (41st percentile). LDL cholesterol was 108 and it was recommended that he start rosuvastatin 10 mg daily.  Patient wanted to know about lifestyle modifications, rather than medications, that could help achieve his goals.    Statin benefits - anti-inflammatory, antioxidant Lifestyle - more soluble fiber (oats, beans, peas, fruit, carrots, psyllium)  Insurance Carrier:   Pharmacy:     LDL Cholesterol goal:  LDL < 70  Current Medications:  none   Previously tried: none  Family Hx:   father had CABG in his 70's  Social Hx: Tobacco: Alcohol:      Diet:      Exercise:   Adherence Assessment  Do you ever forget to take your medication? [] Yes [] No  Do you ever skip doses due to side effects? [] Yes [] No  Do you have trouble affording your medicines? [] Yes [] No  Are you ever unable to pick up your medication due to transportation difficulties? [] Yes [] No  Do you ever stop taking your medications because you don't believe they are helping? [] Yes [] No  Do you check your weight daily? [] Yes [] No   Adherence strategy: ***  Barriers to obtaining medications: ***     Accessory Clinical Findings   Lab Results  Component Value Date   CHOL 180 10/20/2023   HDL 41.90 10/20/2023   LDLCALC 108 (H) 10/20/2023   TRIG 150.0 (H) 10/20/2023   CHOLHDL 4  10/20/2023    No results found for: "LIPOA"  Lab Results  Component Value Date   ALT 40 10/20/2023   AST 27 10/20/2023   ALKPHOS 53 10/20/2023   BILITOT 1.0 10/20/2023   Lab Results  Component Value Date   CREATININE 0.72 10/20/2023   BUN 14 10/20/2023   NA 140 10/20/2023   K 4.6 10/20/2023   CL 104 10/20/2023   CO2 27 10/20/2023   Lab Results  Component Value Date   HGBA1C 5.8 10/20/2023    Home Medications    Current Outpatient Medications  Medication Sig Dispense Refill   fluticasone  (FLONASE ) 50 MCG/ACT nasal spray Place 2 sprays into both nostrils daily. 16 g 0   hydrochlorothiazide  (HYDRODIURIL ) 25 MG tablet Take 1 tablet (25 mg total) by mouth daily. 90 tablet 3   metoprolol  tartrate (LOPRESSOR ) 100 MG tablet Take 1 tablet (100mg ) TWO hours prior to CT scan (Patient not taking: Reported on 10/20/2023) 1 tablet 0   Multiple Vitamin (MULTIVITAMIN WITH MINERALS) TABS tablet Take 1 tablet by mouth daily. Vit c, zinc and others     promethazine -dextromethorphan (PROMETHAZINE -DM) 6.25-15 MG/5ML syrup Take 5 mLs by mouth 4 (four) times daily as needed. 118 mL 0   valsartan  (DIOVAN ) 320 MG tablet Take 1 tablet (320 mg total) by mouth  daily. 90 tablet 3   No current facility-administered medications for this visit.     Assessment & Plan    No problem-specific Assessment & Plan notes found for this encounter.   Duaa Stelzner, PharmD CPP [facility] [address]  [place], [place] [address] [phone]  12/30/2023, 6:20 AM

## 2023-12-31 ENCOUNTER — Encounter (HOSPITAL_BASED_OUTPATIENT_CLINIC_OR_DEPARTMENT_OTHER): Admitting: Pulmonary Disease

## 2024-01-03 DIAGNOSIS — G4733 Obstructive sleep apnea (adult) (pediatric): Secondary | ICD-10-CM | POA: Diagnosis not present

## 2024-01-03 DIAGNOSIS — I10 Essential (primary) hypertension: Secondary | ICD-10-CM | POA: Diagnosis not present

## 2024-01-23 ENCOUNTER — Other Ambulatory Visit (HOSPITAL_BASED_OUTPATIENT_CLINIC_OR_DEPARTMENT_OTHER): Payer: Self-pay

## 2024-01-23 ENCOUNTER — Ambulatory Visit (HOSPITAL_BASED_OUTPATIENT_CLINIC_OR_DEPARTMENT_OTHER): Admitting: Pharmacist Clinician (PhC)/ Clinical Pharmacy Specialist

## 2024-01-23 ENCOUNTER — Encounter (HOSPITAL_BASED_OUTPATIENT_CLINIC_OR_DEPARTMENT_OTHER): Payer: Self-pay

## 2024-01-23 VITALS — BP 108/78 | HR 101 | Ht 72.0 in | Wt 263.0 lb

## 2024-01-23 DIAGNOSIS — E7800 Pure hypercholesterolemia, unspecified: Secondary | ICD-10-CM

## 2024-01-23 MED ORDER — ROSUVASTATIN CALCIUM 5 MG PO TABS
5.0000 mg | ORAL_TABLET | Freq: Every day | ORAL | 3 refills | Status: AC
  Filled 2024-01-23: qty 90, 90d supply, fill #0
  Filled 2024-04-22: qty 90, 90d supply, fill #1
  Filled 2024-07-20: qty 90, 90d supply, fill #2

## 2024-01-23 NOTE — Progress Notes (Signed)
 Office Visit    Patient Name: Charles Adams Date of Encounter: 01/24/2024  Primary Care Provider:  Roslyn Coombe, MD Primary Cardiologist:  None  Chief Complaint    Hyperlipidemia   Significant Past Medical History   HTN Controlled on valsartan  and hctz - followed by PCP  CAD CAC= 0, total plaque volume 28 (41st percentile)    No Known Allergies  History of Present Illness    Charles Adams is a 48 y.o. male patient of Dr Renna Cary, in the office today to discuss options for cholesterol management.  He does not have a personal history of CAD, with a CAC = 0, however it did show minimal (<25%) plaque in the LAD, with a total plaque volume of 28 mm3 (41st percentile). LDL cholesterol was 108 and it was recommended that he start rosuvastatin 10 mg daily.  Patient wanted to know about lifestyle modifications, rather than medications, that could help achieve his goals.    Statin benefits - anti-inflammatory, antioxidant Lifestyle - more soluble fiber (oats, beans, peas, fruit, carrots, psyllium)  Insurance Carrier:  [employer]  Pharmacy:  [facility]  LDL Cholesterol goal:  LDL < 70  Current Medications:  none   Previously tried: none  Family Hx:   father had CABG in his 5's, died from pancreatic cancer, mother living with htn; 2 brother (older), one with hypertension, kids 20, 50, healthy  Social Hx: Tobacco: no Alcohol: no   Diet:   mix of home and out, out does include some fast foods; variety of protein, meats nut; could do more with veggies, fresh or canned; doesn't snack much; mostly water, no soda  Exercise: 20-30 min every other day, elastic resistance bands   Accessory Clinical Findings   Lab Results  Component Value Date   CHOL 180 10/20/2023   HDL 41.90 10/20/2023   LDLCALC 108 (H) 10/20/2023   TRIG 150.0 (H) 10/20/2023   CHOLHDL 4 10/20/2023    No results found for: "LIPOA"  Lab Results  Component Value Date   ALT 40 10/20/2023   AST 27 10/20/2023    ALKPHOS 53 10/20/2023   BILITOT 1.0 10/20/2023   Lab Results  Component Value Date   CREATININE 0.72 10/20/2023   BUN 14 10/20/2023   NA 140 10/20/2023   K 4.6 10/20/2023   CL 104 10/20/2023   CO2 27 10/20/2023   Lab Results  Component Value Date   HGBA1C 5.8 10/20/2023    Home Medications    Current Outpatient Medications  Medication Sig Dispense Refill   hydrochlorothiazide  (HYDRODIURIL ) 25 MG tablet Take 1 tablet (25 mg total) by mouth daily. 90 tablet 3   Multiple Vitamin (MULTIVITAMIN WITH MINERALS) TABS tablet Take 1 tablet by mouth daily. Vit c, zinc and others     rosuvastatin (CRESTOR) 5 MG tablet Take 1 tablet (5 mg total) by mouth daily. 90 tablet 3   valsartan  (DIOVAN ) 320 MG tablet Take 1 tablet (320 mg total) by mouth daily. 90 tablet 3   fluticasone  (FLONASE ) 50 MCG/ACT nasal spray Place 2 sprays into both nostrils daily. 16 g 0   metoprolol  tartrate (LOPRESSOR ) 100 MG tablet Take 1 tablet (100mg ) TWO hours prior to CT scan (Patient not taking: Reported on 10/20/2023) 1 tablet 0   promethazine -dextromethorphan (PROMETHAZINE -DM) 6.25-15 MG/5ML syrup Take 5 mLs by mouth 4 (four) times daily as needed. 118 mL 0   No current facility-administered medications for this visit.     Assessment & Plan  Hyperlipidemia Assessment: Patient with mild CAD not at LDL goal of < 70 Most recent LDL 108 on 10/20/23 Has not yet tried any medications for cholesterol management Had long discussion on the risk/benefit profile of statin drugs, his cholesterol levels and coronary calcium CT.  Reviewed that 90% of people on statins do well, and while they can have side effects, he would benefit from starting with a moderate intensity dose.  Also reviewed long term risks related to plaque buildup in the arteries.   Plan: Patient agreeable to starting rosuvastatin 5 mg daily Repeat labs after:  2-3 months Lipid Liver function    Donivan Furry, PharmD CPP [facility] [address]  [address]  [place], [place] [address] [phone]  01/24/2024, 8:00 AM

## 2024-01-23 NOTE — Patient Instructions (Signed)
 Your Results:             Your most recent labs Goal  Total Cholesterol 180 < 200  Triglycerides 150 < 150  HDL (happy/good cholesterol) 41.9 > 40  LDL (lousy/bad cholesterol 108 < 100   Medication changes:  START ROSUVASTATIN 5 MG ONCE DAILY.  Lab orders:  We want to repeat labs after 2-3 months.  We will send you a lab order to remind you once we get closer to that time.     Thank you for choosing [facility]

## 2024-01-24 ENCOUNTER — Encounter (HOSPITAL_BASED_OUTPATIENT_CLINIC_OR_DEPARTMENT_OTHER): Payer: Self-pay | Admitting: Pharmacist Clinician (PhC)/ Clinical Pharmacy Specialist

## 2024-01-24 NOTE — Assessment & Plan Note (Signed)
 Assessment: Patient with mild CAD not at LDL goal of < 70 Most recent LDL 108 on 10/20/23 Has not yet tried any medications for cholesterol management Had long discussion on the risk/benefit profile of statin drugs, his cholesterol levels and coronary calcium CT.  Reviewed that 90% of people on statins do well, and while they can have side effects, he would benefit from starting with a moderate intensity dose.  Also reviewed long term risks related to plaque buildup in the arteries.   Plan: Patient agreeable to starting rosuvastatin 5 mg daily Repeat labs after:  2-3 months Lipid Liver function

## 2024-02-03 DIAGNOSIS — G4733 Obstructive sleep apnea (adult) (pediatric): Secondary | ICD-10-CM | POA: Diagnosis not present

## 2024-02-03 DIAGNOSIS — I10 Essential (primary) hypertension: Secondary | ICD-10-CM | POA: Diagnosis not present

## 2024-02-09 ENCOUNTER — Other Ambulatory Visit (HOSPITAL_BASED_OUTPATIENT_CLINIC_OR_DEPARTMENT_OTHER): Payer: Self-pay

## 2024-02-23 ENCOUNTER — Ambulatory Visit: Payer: Commercial Managed Care - PPO | Admitting: Internal Medicine

## 2024-02-23 ENCOUNTER — Encounter: Payer: Self-pay | Admitting: Internal Medicine

## 2024-02-23 ENCOUNTER — Ambulatory Visit (INDEPENDENT_AMBULATORY_CARE_PROVIDER_SITE_OTHER): Admitting: Internal Medicine

## 2024-02-23 VITALS — BP 124/82 | HR 73 | Temp 98.3°F | Ht 72.0 in | Wt 261.0 lb

## 2024-02-23 DIAGNOSIS — E7800 Pure hypercholesterolemia, unspecified: Secondary | ICD-10-CM

## 2024-02-23 DIAGNOSIS — R739 Hyperglycemia, unspecified: Secondary | ICD-10-CM | POA: Diagnosis not present

## 2024-02-23 DIAGNOSIS — E559 Vitamin D deficiency, unspecified: Secondary | ICD-10-CM

## 2024-02-23 DIAGNOSIS — G4733 Obstructive sleep apnea (adult) (pediatric): Secondary | ICD-10-CM | POA: Diagnosis not present

## 2024-02-23 DIAGNOSIS — E538 Deficiency of other specified B group vitamins: Secondary | ICD-10-CM

## 2024-02-23 DIAGNOSIS — Z125 Encounter for screening for malignant neoplasm of prostate: Secondary | ICD-10-CM

## 2024-02-23 DIAGNOSIS — I10 Essential (primary) hypertension: Secondary | ICD-10-CM

## 2024-02-23 NOTE — Assessment & Plan Note (Signed)
 Recent start CPAP about Dec 02 2023 per Dr Jude, tolerates ok.

## 2024-02-23 NOTE — Patient Instructions (Signed)
Please continue all other medications as before, and refills have been done if requested.  Please have the pharmacy call with any other refills you may need.  Please continue your efforts at being more active, low cholesterol diet, and weight control.  Please keep your appointments with your specialists as you may have planned  Please make an Appointment to return for your 1 year visit, or sooner if needed, with Lab testing by Appointment as well, to be done about 3-5 days before at the [facility] (so this is for TWO appointments - please see the scheduling desk as you leave)

## 2024-02-23 NOTE — Progress Notes (Unsigned)
 Patient ID: Charles Adams, male   DOB: 05/22/1976, 48 y.o.   MRN: [id number]        Chief Complaint: follow up HTN, HLD and hyperglycemia, low vit d, OSA, low b12       HPI:  Charles Adams is a 48 y.o. male here overall doing ok.   Pt denies chest pain, increased sob or doe, wheezing, orthopnea, PND, increased LE swelling, palpitations, dizziness or syncope.   Pt denies polydipsia, polyuria, or new focal neuro s/s.    Pt denies fever, wt loss, night sweats, loss of appetite, or other constitutional symptoms  Started CPAP for OSA recently doing well.   Recent CPAP start apr 1, doing well, with improved daytime somnolence and stamina.  Now planning to be more active.   Wt Readings from Last 3 Encounters:  02/23/24 261 lb (118.4 kg)  01/23/24 263 lb (119.3 kg)  10/20/23 257 lb (116.6 kg)   BP Readings from Last 3 Encounters:  02/23/24 124/82  01/23/24 108/78  11/11/23 100/74         Past Medical History:  Diagnosis Date   Allergic rhinitis, cause unspecified    Allergy    History of kidney stones    HTN (hypertension) 04/02/2020   Hypertension    Numerous moles 12/15/2013   Past Surgical History:  Procedure Laterality Date   EXTRACORPOREAL SHOCK WAVE LITHOTRIPSY Left 02/28/2020   Procedure: EXTRACORPOREAL SHOCK WAVE LITHOTRIPSY (ESWL);  Surgeon: Renda Glance, MD;  Location: [facility];  Service: Urology;  Laterality: Left;   eye surgury Right 48yo   right eye metal fragment removal   WISDOM TOOTH EXTRACTION      reports that he has never smoked. He has never used smokeless tobacco. He reports that he does not drink alcohol and does not use drugs. family history includes Arthritis in an other family member; Cancer in his father and another family member; Diabetes in an other family member; Heart disease in his father and another family member; Hypertension in his father and mother. No Known Allergies Current Outpatient Medications on File Prior to Visit   Medication Sig Dispense Refill   hydrochlorothiazide  (HYDRODIURIL ) 25 MG tablet Take 1 tablet (25 mg total) by mouth daily. 90 tablet 3   Multiple Vitamin (MULTIVITAMIN WITH MINERALS) TABS tablet Take 1 tablet by mouth daily. Vit c, zinc and others     rosuvastatin  (CRESTOR ) 5 MG tablet Take 1 tablet (5 mg total) by mouth daily. 90 tablet 3   valsartan  (DIOVAN ) 320 MG tablet Take 1 tablet (320 mg total) by mouth daily. 90 tablet 3   No current facility-administered medications on file prior to visit.        ROS:  All others reviewed and negative.  Objective        PE:  BP 124/82 (BP Location: Right Arm, Patient Position: Sitting, Cuff Size: Normal)   Pulse 73   Temp 98.3 F (36.8 C) (Oral)   Ht 6' (1.829 m)   Wt 261 lb (118.4 kg)   SpO2 98%   BMI 35.40 kg/m                 Constitutional: Pt appears in NAD               HENT: Head: NCAT.                Right Ear: External ear normal.  Left Ear: External ear normal.                Eyes: . Pupils are equal, round, and reactive to light. Conjunctivae and EOM are normal               Nose: without d/c or deformity               Neck: Neck supple. Gross normal ROM               Cardiovascular: Normal rate and regular rhythm.                 Pulmonary/Chest: Effort normal and breath sounds without rales or wheezing.                Abd:  Soft, NT, ND, + BS, no organomegaly               Neurological: Pt is alert. At baseline orientation, motor grossly intact               Skin: Skin is warm. No rashes, no other new lesions, LE edema - none               Psychiatric: Pt behavior is normal without agitation   Micro: none  Cardiac tracings I have personally interpreted today:  none  Pertinent Radiological findings (summarize): none   Lab Results  Component Value Date   WBC 5.3 10/20/2023   HGB 14.3 10/20/2023   HCT 42.4 10/20/2023   PLT 213.0 10/20/2023   GLUCOSE 92 10/20/2023   CHOL 180 10/20/2023   TRIG  150.0 (H) 10/20/2023   HDL 41.90 10/20/2023   LDLCALC 108 (H) 10/20/2023   ALT 40 10/20/2023   AST 27 10/20/2023   NA 140 10/20/2023   K 4.6 10/20/2023   CL 104 10/20/2023   CREATININE 0.72 10/20/2023   BUN 14 10/20/2023   CO2 27 10/20/2023   TSH 2.06 10/20/2023   PSA 1.15 10/20/2023   HGBA1C 5.8 10/20/2023   Assessment/Plan:  Charles Adams is a 48 y.o. White or Caucasian [1] male with  has a past medical history of Allergic rhinitis, cause unspecified, Allergy, History of kidney stones, HTN (hypertension) (04/02/2020), Hypertension, and Numerous moles (12/15/2013).  Vitamin D  deficiency Last vitamin D  Lab Results  Component Value Date   Charles Adams 73.00 10/20/2023   Stable, cont oral replacement   OSA (obstructive sleep apnea) Recent start CPAP about Dec 02 2023 per Dr Jude, tolerates ok.   Hyperlipidemia Lab Results  Component Value Date   LDLCALC 108 (H) 10/20/2023   Uncontrolled, pt to continue current statin crestor  5 mg - declines change today   HTN (hypertension) BP Readings from Last 3 Encounters:  02/23/24 124/82  01/23/24 108/78  11/11/23 100/74   Stable, pt to continue medical treatment diovan  320 every day, hct 25 qd   B12 deficiency Lab Results  Component Value Date   Charles Adams 254 10/20/2023   Low, to start oral replacement - b12 1000 mcg qd  Followup: Return in about 1 year (around 02/22/2025).  Lynwood Rush, MD 02/24/2024 4:17 PM [facility]

## 2024-02-23 NOTE — Assessment & Plan Note (Signed)
 Last vitamin D Lab Results  Component Value Date   VD25OH 73.00 10/20/2023   Stable, cont oral replacement

## 2024-02-24 ENCOUNTER — Encounter: Payer: Self-pay | Admitting: Internal Medicine

## 2024-02-24 NOTE — Assessment & Plan Note (Signed)
 Lab Results  Component Value Date   LDLCALC 108 (H) 10/20/2023   Uncontrolled, pt to continue current statin crestor  5 mg - declines change today

## 2024-02-24 NOTE — Assessment & Plan Note (Signed)
 BP Readings from Last 3 Encounters:  02/23/24 124/82  01/23/24 108/78  11/11/23 100/74   Stable, pt to continue medical treatment diovan  320 every day, hct 25 qd

## 2024-02-24 NOTE — Assessment & Plan Note (Signed)
 Lab Results  Component Value Date   [name] 254 10/20/2023   Low, to start oral replacement - b12 1000 mcg qd

## 2024-03-04 DIAGNOSIS — G4733 Obstructive sleep apnea (adult) (pediatric): Secondary | ICD-10-CM | POA: Diagnosis not present

## 2024-03-04 DIAGNOSIS — I10 Essential (primary) hypertension: Secondary | ICD-10-CM | POA: Diagnosis not present

## 2024-03-08 DIAGNOSIS — H0288B Meibomian gland dysfunction left eye, upper and lower eyelids: Secondary | ICD-10-CM | POA: Diagnosis not present

## 2024-03-08 DIAGNOSIS — H0288A Meibomian gland dysfunction right eye, upper and lower eyelids: Secondary | ICD-10-CM | POA: Diagnosis not present

## 2024-03-08 DIAGNOSIS — H16423 Pannus (corneal), bilateral: Secondary | ICD-10-CM | POA: Diagnosis not present

## 2024-03-08 DIAGNOSIS — H04123 Dry eye syndrome of bilateral lacrimal glands: Secondary | ICD-10-CM | POA: Diagnosis not present

## 2024-04-04 DIAGNOSIS — I10 Essential (primary) hypertension: Secondary | ICD-10-CM | POA: Diagnosis not present

## 2024-04-04 DIAGNOSIS — G4733 Obstructive sleep apnea (adult) (pediatric): Secondary | ICD-10-CM | POA: Diagnosis not present

## 2024-05-05 DIAGNOSIS — G4733 Obstructive sleep apnea (adult) (pediatric): Secondary | ICD-10-CM | POA: Diagnosis not present

## 2024-05-05 DIAGNOSIS — I10 Essential (primary) hypertension: Secondary | ICD-10-CM | POA: Diagnosis not present

## 2024-06-04 DIAGNOSIS — H5213 Myopia, bilateral: Secondary | ICD-10-CM | POA: Diagnosis not present

## 2024-06-04 DIAGNOSIS — H52203 Unspecified astigmatism, bilateral: Secondary | ICD-10-CM | POA: Diagnosis not present

## 2024-06-04 DIAGNOSIS — I10 Essential (primary) hypertension: Secondary | ICD-10-CM | POA: Diagnosis not present

## 2024-06-04 DIAGNOSIS — H524 Presbyopia: Secondary | ICD-10-CM | POA: Diagnosis not present

## 2024-06-04 DIAGNOSIS — G4733 Obstructive sleep apnea (adult) (pediatric): Secondary | ICD-10-CM | POA: Diagnosis not present

## 2024-06-24 DIAGNOSIS — E7800 Pure hypercholesterolemia, unspecified: Secondary | ICD-10-CM | POA: Diagnosis not present

## 2024-06-25 LAB — HEPATIC FUNCTION PANEL
ALT: 68 IU/L — ABNORMAL HIGH (ref 0–44)
AST: 50 IU/L — ABNORMAL HIGH (ref 0–40)
Albumin: 4.3 g/dL (ref 4.1–5.1)
Alkaline Phosphatase: 59 IU/L (ref 47–123)
Bilirubin Total: 1 mg/dL (ref 0.0–1.2)
Bilirubin, Direct: 0.33 mg/dL (ref 0.00–0.40)
Total Protein: 6.6 g/dL (ref 6.0–8.5)

## 2024-06-25 LAB — LIPID PANEL
Chol/HDL Ratio: 3.9 ratio (ref 0.0–5.0)
Cholesterol, Total: 144 mg/dL (ref 100–199)
HDL: 37 mg/dL — ABNORMAL LOW (ref >39)
LDL Chol Calc (NIH): 78 mg/dL (ref 0–99)
Triglycerides: 167 mg/dL — ABNORMAL HIGH (ref 0–149)
VLDL Cholesterol Cal: 29 mg/dL (ref 5–40)

## 2024-06-30 ENCOUNTER — Encounter: Payer: Self-pay | Admitting: Pharmacist Clinician (PhC)/ Clinical Pharmacy Specialist

## 2024-06-30 ENCOUNTER — Ambulatory Visit: Payer: Self-pay | Admitting: Pharmacist Clinician (PhC)/ Clinical Pharmacy Specialist

## 2024-06-30 ENCOUNTER — Other Ambulatory Visit (HOSPITAL_BASED_OUTPATIENT_CLINIC_OR_DEPARTMENT_OTHER): Payer: Self-pay

## 2024-06-30 DIAGNOSIS — E7800 Pure hypercholesterolemia, unspecified: Secondary | ICD-10-CM

## 2024-06-30 MED ORDER — FLUZONE 0.5 ML IM SUSY
0.5000 mL | PREFILLED_SYRINGE | Freq: Once | INTRAMUSCULAR | 0 refills | Status: AC
Start: 2024-06-30 — End: 2024-07-01
  Filled 2024-06-30: qty 0.5, 1d supply, fill #0

## 2024-07-05 DIAGNOSIS — I10 Essential (primary) hypertension: Secondary | ICD-10-CM | POA: Diagnosis not present

## 2024-07-22 ENCOUNTER — Ambulatory Visit (HOSPITAL_BASED_OUTPATIENT_CLINIC_OR_DEPARTMENT_OTHER): Admitting: Pulmonary Disease

## 2024-07-22 ENCOUNTER — Encounter (HOSPITAL_BASED_OUTPATIENT_CLINIC_OR_DEPARTMENT_OTHER): Payer: Self-pay | Admitting: Pulmonary Disease

## 2024-07-22 VITALS — BP 113/88 | HR 80 | Ht 72.0 in | Wt 273.2 lb

## 2024-07-22 DIAGNOSIS — G4733 Obstructive sleep apnea (adult) (pediatric): Secondary | ICD-10-CM

## 2024-07-22 NOTE — Progress Notes (Signed)
   Subjective:    Patient ID: Charles Adams, male    DOB: Jul 13, 1976, 48 y.o.   MRN: [id number]   48 yo am for FU of OSA  Discussed the use of AI scribe software for clinical note transcription with the patient, who gave verbal consent to proceed.  History of Present Illness Charles Adams is a 48 year old male with severe obstructive sleep apnea who presents for follow-up after CPAP setup.  He experiences increased energy levels since starting CPAP therapy. A home sleep test initially showed severe obstructive sleep apnea with 38 apnea events per hour and oxygen desaturation to 80%. With CPAP use, apnea events have decreased to less than 2 per hour. He uses a mask that covers his mouth and nose, which he generally finds comfortable despite occasional nasal soreness. The mask has a top attachment to accommodate his changing sleep positions.  He missed a few nights of CPAP use due to a respiratory illness. On average, he uses the CPAP for 5 to 6 hours per night. Occasionally, he wakes up without the mask and reattaches it later. There are no complaints from others about the CPAP machine, and no significant issues with the mask seal, although there are occasional alerts to adjust it.      Significant tests/ events reviewed  10/2023 HST showed severe  OSA with AHI 38/ hr & low sat of 80% Few centrals noted   Review of Systems  neg for any significant sore throat, dysphagia, itching, sneezing, nasal congestion or excess/ purulent secretions, fever, chills, sweats, unintended wt loss, pleuritic or exertional cp, hempoptysis, orthopnea pnd or change in chronic leg swelling. Also denies presyncope, palpitations, heartburn, abdominal pain, nausea, vomiting, diarrhea or change in bowel or urinary habits, dysuria,hematuria, rash, arthralgias, visual complaints, headache, numbness weakness or ataxia.      Objective:   Physical Exam  Gen. Pleasant, obese, in no distress ENT - no  lesions, no post nasal drip Neck: No JVD, no thyromegaly, no carotid bruits Lungs: no use of accessory muscles, no dullness to percussion, decreased without rales or rhonchi  Cardiovascular: Rhythm regular, heart sounds  normal, no murmurs or gallops, no peripheral edema Musculoskeletal: No deformities, no cyanosis or clubbing , no tremors       Assessment & Plan:   Assessment and Plan Assessment & Plan Obstructive sleep apnea managed with CPAP therapy Severe obstructive sleep apnea with 130 apneic events over five hours, averaging 38 events per hour. Oxygen saturation dropped to 80%. CPAP therapy has reduced events to 1.6 per hour, indicating effective management. He reports improved energy levels and better sleep quality. Current CPAP settings are auto-adjusting with an average pressure of 8 cm H2O and a maximum of 9 cm H2O. He uses the CPAP for an average of 5-6 hours per night, meeting insurance criteria. Occasional mask seal issues noted but not bothersome. No significant complaints from him or his partner. CPAP therapy is reducing cardiac risk associated with untreated sleep apnea. - Adjusted CPAP pressure settings to 5-10 cm H2O. - Continue current CPAP mask (F30) or consider F30+ if desired. - Ensure annual face-to-face visit for prescription form signing. - Encouraged weight loss to potentially reduce CPAP pressure needs. - Monitor for significant weight changes that may necessitate retesting.

## 2024-07-22 NOTE — Addendum Note (Signed)
 Addended by: TRUDY WARREN CROME on: 07/22/2024 09:11 AM   Modules accepted: Orders

## 2024-07-22 NOTE — Patient Instructions (Signed)
 X Change to autoCPAP 5-10 cm  CPAP supplies will be renewed

## 2024-08-04 DIAGNOSIS — G4733 Obstructive sleep apnea (adult) (pediatric): Secondary | ICD-10-CM | POA: Diagnosis not present

## 2024-08-04 DIAGNOSIS — I10 Essential (primary) hypertension: Secondary | ICD-10-CM | POA: Diagnosis not present

## 2024-09-03 ENCOUNTER — Telehealth: Admitting: Emergency Medicine

## 2024-09-03 ENCOUNTER — Other Ambulatory Visit (HOSPITAL_BASED_OUTPATIENT_CLINIC_OR_DEPARTMENT_OTHER): Payer: Self-pay

## 2024-09-03 DIAGNOSIS — J069 Acute upper respiratory infection, unspecified: Secondary | ICD-10-CM

## 2024-09-03 MED ORDER — DM-GUAIFENESIN ER 30-600 MG PO TB12
1.0000 | ORAL_TABLET | Freq: Two times a day (BID) | ORAL | 0 refills | Status: AC
  Filled 2024-09-03: qty 20, 10d supply, fill #0

## 2024-09-03 NOTE — Progress Notes (Signed)
 We are sorry you are not feeling well.  Here is how we plan to help!  Based on what you have shared with me, it looks like you may have a viral upper respiratory infection.  Upper respiratory infections are caused by a large number of viruses; however, rhinovirus is the most common cause.   Symptoms vary from person to person, with common symptoms including sore throat, cough, and fatigue or lack of energy, and a feeling of general discomfort.  A low-grade fever of up to 100.4 may present, but is often uncommon.  Symptoms vary however, and are closely related to a person's age or underlying illnesses.  The most common symptoms associated with an upper respiratory infection are nasal discharge or congestion, cough, sneezing, headache and pressure in the ears and face.  These symptoms usually persist for about 3 to 10 days, but can last up to 2 weeks.  It is important to know that upper respiratory infections do not cause serious illness or complications in most cases.    Upper respiratory infections can be transmitted from person to person, with the most common method of transmission being a person's hands.  The virus is able to live on the skin and can infect other persons for up to 2 hours after direct contact.  Also, these can be transmitted when someone coughs or sneezes; thus, it is important to cover the mouth to reduce this risk.  To keep the spread of the illness at bay, good hand hygiene is very important!  Because this is a viral infection, there are no specific treatments other than to help you with the symptoms until the infection runs its course.    For nasal congestion, you may use an oral decongestants such as Mucinex D or if you have glaucoma or high blood pressure use plain Mucinex.  Saline nasal spray or nasal drops can help and can safely be used as often as needed for congestion.  For your congestion, I have prescribed Fluticasone  nasal spray one spray in each nostril twice a day.  I've  also prescribed Mucinex (the stronger one) to help clear the congestion.  Providers prescribe antibiotics to treat infections caused by bacteria. Antibiotics are very powerful in treating bacterial infections when they are used properly. To maintain their effectiveness, they should be used only when necessary. Overuse of antibiotics has resulted in the development of superbugs that are resistant to treatment!    After careful review of your answers, I would not recommend an antibiotic for your condition.  Antibiotics are not effective against viruses and therefore should not be used to treat them. Common examples of infections caused by viruses include colds and flu   If you do not have a history of heart disease, hypertension, diabetes or thyroid  disease, prostate/bladder issues or glaucoma, you may also use Sudafed to treat nasal congestion.  It is highly recommended that you consult with a pharmacist or your primary care physician to ensure this medication is safe for you to take.     If you have a cough, you may use over-the-counter cough suppressants such as Delsym and Robitussin.  If you have glaucoma or high blood pressure, you can also use Coricidin HBP.    If you have a sore or scratchy throat, use a saltwater gargle-  to  teaspoon of salt dissolved in a 4-ounce to 8-ounce glass of warm water.  Gargle the solution for approximately 15-30 seconds and then spit.  It is important not to  swallow the solution.  You can also use throat lozenges/cough drops and Chloraseptic spray to help with throat pain or discomfort.  Warm or cold liquids can also be helpful in relieving throat pain For headache, pain, or general discomfort, you can use Ibuprofen or Tylenol as directed.   Some authorities believe that zinc sprays or the use of Echinacea may shorten the course of your symptoms.   HOME CARE Only take medications as instructed by your medical team. Be sure to drink plenty of fluids. Water is fine  as well as fruit juices, sodas and electrolyte beverages. You may want to stay away from caffeine or alcohol. If you are nauseated, try taking small sips of liquids. How do you know if you are getting enough fluid? Your urine should be a pale yellow or almost colorless. Get rest. Taking a steamy shower or using a humidifier may help nasal congestion and ease sore throat pain. You can place a towel over your head and breathe in the steam from hot water coming from a faucet. Using a saline nasal spray works much the same way. Cough drops, hard candies and sore throat lozenges may ease your cough. Avoid close contacts especially the very young and the elderly Cover your mouth if you cough or sneeze Always remember to wash your hands.   GET HELP RIGHT AWAY IF: You develop worsening fever. If your symptoms do not improve within 10 days You develop yellow or green discharge from your nose over 3 days. You have coughing fits You develop a severe head ache or visual changes. You develop shortness of breath, difficulty breathing or start having chest pain Your symptoms persist after you have completed your treatment plan  MAKE SURE YOU  Understand these instructions. Will watch your condition. Will get help right away if you are not doing well or get worse.  Your e-visit answers were reviewed by a board certified advanced clinical practitioner to complete your personal care plan. Depending upon the condition, your plan could have included both over-the-counter or prescription medications.  Please review your pharmacy choice. If there is a problem, you may call our nursing hot line at and have the prescription routed to another pharmacy. Your safety is important to us . If you have drug allergies, check your prescription carefully.   You can use MyChart to ask questions about todays visit, request a non-urgent call back, or ask for a work or school excuse for 24 hours related to this e-Visit. If it  has been greater than 24 hours you will need to follow up with your provider, or enter a new e-Visit to address those concerns. You will get an e-mail in the next two days asking about your experience.  I hope that your e-visit has been valuable and will speed your recovery. Thank you for using e-visits.   I have spent 5 minutes in review of e-visit questionnaire, review and updating patient chart, medical decision making and response to patient.   Lamar Schlossman, PA-C

## 2024-09-17 LAB — LIPID PANEL
Chol/HDL Ratio: 3.9 ratio (ref 0.0–5.0)
Cholesterol, Total: 154 mg/dL (ref 100–199)
HDL: 39 mg/dL — ABNORMAL LOW
LDL Chol Calc (NIH): 92 mg/dL (ref 0–99)
Triglycerides: 128 mg/dL (ref 0–149)
VLDL Cholesterol Cal: 23 mg/dL (ref 5–40)

## 2025-01-13 ENCOUNTER — Encounter: Admitting: Family Medicine

## 2025-02-23 ENCOUNTER — Encounter: Admitting: Internal Medicine
